# Patient Record
Sex: Female | Born: 1960 | Race: Black or African American | Hispanic: No | Marital: Married | Smoking: Never smoker
Health system: Southern US, Community
[De-identification: ages and names within clinical notes are randomized; demographics above are authoritative.]

## PROBLEM LIST (undated history)

## (undated) DIAGNOSIS — K649 Unspecified hemorrhoids: Secondary | ICD-10-CM

## (undated) DIAGNOSIS — E119 Type 2 diabetes mellitus without complications: Secondary | ICD-10-CM

## (undated) DIAGNOSIS — I10 Essential (primary) hypertension: Secondary | ICD-10-CM

## (undated) DIAGNOSIS — E785 Hyperlipidemia, unspecified: Secondary | ICD-10-CM

## (undated) HISTORY — DX: Hyperlipidemia, unspecified: E78.5

## (undated) HISTORY — DX: Essential (primary) hypertension: I10

## (undated) HISTORY — PX: ABDOMINAL HYSTERECTOMY: SHX81

## (undated) HISTORY — DX: Type 2 diabetes mellitus without complications: E11.9

## (undated) HISTORY — DX: Unspecified hemorrhoids: K64.9

## (undated) HISTORY — PX: HAND SURGERY: SHX662

---

## 2003-11-02 ENCOUNTER — Other Ambulatory Visit: Payer: Self-pay

## 2005-04-29 HISTORY — PX: COLONOSCOPY: SHX174

## 2005-06-26 ENCOUNTER — Ambulatory Visit: Payer: Self-pay | Admitting: Internal Medicine

## 2005-07-23 ENCOUNTER — Ambulatory Visit: Payer: Self-pay | Admitting: Internal Medicine

## 2006-09-25 ENCOUNTER — Ambulatory Visit: Payer: Self-pay | Admitting: Family Medicine

## 2007-01-19 ENCOUNTER — Ambulatory Visit: Payer: Self-pay | Admitting: Internal Medicine

## 2011-10-21 ENCOUNTER — Other Ambulatory Visit (HOSPITAL_COMMUNITY): Payer: Self-pay | Admitting: Internal Medicine

## 2011-10-21 DIAGNOSIS — Z139 Encounter for screening, unspecified: Secondary | ICD-10-CM

## 2011-10-21 DIAGNOSIS — Z0000 Encounter for general adult medical examination without abnormal findings: Secondary | ICD-10-CM

## 2011-10-25 ENCOUNTER — Inpatient Hospital Stay (HOSPITAL_COMMUNITY): Admission: RE | Admit: 2011-10-25 | Payer: Self-pay | Source: Ambulatory Visit

## 2013-05-07 ENCOUNTER — Emergency Department: Payer: Self-pay | Admitting: Emergency Medicine

## 2013-12-07 ENCOUNTER — Telehealth: Payer: Self-pay

## 2013-12-07 NOTE — Telephone Encounter (Signed)
Pt was previously referred by Dr. Gerarda Fraction in May 2015 for colonoscopy. She was sent a letter to check insurance and call with med list etc. Just received another referral saying she is ready to schedule appt now.   LMOM to call.

## 2013-12-08 NOTE — Telephone Encounter (Addendum)
  Gastroenterology Pre-Procedure Review  Request Date: Requesting Physician: Fusco  PATIENT REVIEW QUESTIONS: The patient responded to the following health history questions as indicated:    1. Diabetes Melitis: Yes 2. Joint replacements in the past 12 months: NO 3. Major health problems in the past 3 months: No 4. Has an artificial valve or MVP: NO 5. Has a defibrillator: NO 6. Has been advised in past to take antibiotics in advance of a procedure like teeth cleaning: No 7. Alcohol: No 8. Family History: NO     MEDICATIONS & ALLERGIES:    Patient reports the following regarding taking any blood thinners:   Plavix? NO Aspirin? YES Coumadin? NO  Patient confirms/reports the following medications:  No current outpatient prescriptions on file.   No current facility-administered medications for this visit.    Patient confirms/reports the following allergies:  Allergies not on file  No orders of the defined types were placed in this encounter.    AUTHORIZATION INFORMATION Primary Insurance: [employer] ID #:[id number]   Group #: [id number] Pre-Cert / Josem Kaufmann required:  Pre-Cert / Auth #:   This Gastroenterology Pre-Precedure Review Form is being routed to the following provider(s):

## 2013-12-10 NOTE — Telephone Encounter (Signed)
LMOM to call.

## 2013-12-13 ENCOUNTER — Other Ambulatory Visit: Payer: Self-pay

## 2013-12-13 DIAGNOSIS — Z1211 Encounter for screening for malignant neoplasm of colon: Secondary | ICD-10-CM

## 2013-12-13 NOTE — Telephone Encounter (Signed)
Pt called and is scheduled for colonoscopy with Dr. Gala Romney on 12/29/2013 at 8:30 AM.

## 2013-12-14 NOTE — Telephone Encounter (Signed)
PT called to reschedule her colonoscopy due to transportation. She has been rescheduled to 01/12/2014 at 9:30 AM ( pt request time to have transportation). Denise Madden is aware.

## 2013-12-15 NOTE — Telephone Encounter (Signed)
Appropriate.

## 2013-12-16 MED ORDER — PEG-KCL-NACL-NASULF-NA ASC-C 100 G PO SOLR: 1.0000 | ORAL | Status: DC

## 2013-12-16 NOTE — Telephone Encounter (Signed)
Rx sent to the pharmacy and instructions mailed to pt.  

## 2013-12-30 ENCOUNTER — Encounter (HOSPITAL_COMMUNITY): Payer: Self-pay | Admitting: Pharmacy Technician

## 2013-12-31 ENCOUNTER — Telehealth: Payer: Self-pay

## 2013-12-31 NOTE — Telephone Encounter (Signed)
Pt called and cancelled her appt on 01/12/2014 with Dr. Gala Romney. Denise Madden she has a lot going on now and she will be out of town in October, she will call and reschedule in Nov. [name] is aware.

## 2014-01-12 ENCOUNTER — Ambulatory Visit (HOSPITAL_COMMUNITY): Admission: RE | Admit: 2014-01-12 | Payer: Self-pay | Source: Ambulatory Visit | Admitting: Internal Medicine

## 2014-01-12 ENCOUNTER — Encounter (HOSPITAL_COMMUNITY): Admission: RE | Payer: Self-pay | Source: Ambulatory Visit

## 2014-01-12 SURGERY — COLONOSCOPY
Anesthesia: Moderate Sedation

## 2015-11-01 ENCOUNTER — Encounter: Payer: Self-pay | Admitting: *Deleted

## 2015-11-16 ENCOUNTER — Encounter: Payer: Self-pay | Admitting: General Surgery

## 2015-11-16 ENCOUNTER — Ambulatory Visit (INDEPENDENT_AMBULATORY_CARE_PROVIDER_SITE_OTHER): Payer: BLUE CROSS/BLUE SHIELD | Admitting: General Surgery

## 2015-11-16 ENCOUNTER — Other Ambulatory Visit: Payer: Self-pay

## 2015-11-16 VITALS — BP 130/70 | HR 78 | Resp 12 | Ht 64.0 in | Wt 176.0 lb

## 2015-11-16 DIAGNOSIS — E041 Nontoxic single thyroid nodule: Secondary | ICD-10-CM

## 2015-11-16 DIAGNOSIS — Z1211 Encounter for screening for malignant neoplasm of colon: Secondary | ICD-10-CM

## 2015-11-16 MED ORDER — POLYETHYLENE GLYCOL 3350 17 GM/SCOOP PO POWD: 1.0000 | Freq: Once | ORAL | Status: DC

## 2015-11-16 NOTE — Patient Instructions (Addendum)
Colonoscopy A colonoscopy is an exam to look at the entire large intestine (colon). This exam can help find problems such as tumors, polyps, inflammation, and areas of bleeding. The exam takes about 1 hour.  LET [facility] CARE PROVIDER KNOW ABOUT:   Any allergies you have.  All medicines you are taking, including vitamins, herbs, eye drops, creams, and over-the-counter medicines.  Previous problems you or members of your family have had with the use of anesthetics.  Any blood disorders you have.  Previous surgeries you have had.  Medical conditions you have. RISKS AND COMPLICATIONS  Generally, this is a safe procedure. However, as with any procedure, complications can occur. Possible complications include:  Bleeding.  Tearing or rupture of the colon wall.  Reaction to medicines given during the exam.  Infection (rare). BEFORE THE PROCEDURE   Ask your health care provider about changing or stopping your regular medicines.  You may be prescribed an oral bowel prep. This involves drinking a large amount of medicated liquid, starting the day before your procedure. The liquid will cause you to have multiple loose stools until your stool is almost clear or light green. This cleans out your colon in preparation for the procedure.  Do not eat or drink anything else once you have started the bowel prep, unless your health care provider tells you it is safe to do so.  Arrange for someone to drive you home after the procedure. PROCEDURE   You will be given medicine to help you relax (sedative).  You will lie on your side with your knees bent.  A long, flexible tube with a light and camera on the end (colonoscope) will be inserted through the rectum and into the colon. The camera sends video back to a computer screen as it moves through the colon. The colonoscope also releases carbon dioxide gas to inflate the colon. This helps your health care provider see the area better.  During  the exam, your health care provider may take a small tissue sample (biopsy) to be examined under a microscope if any abnormalities are found.  The exam is finished when the entire colon has been viewed. AFTER THE PROCEDURE   Do not drive for 24 hours after the exam.  You may have a small amount of blood in your stool.  You may pass moderate amounts of gas and have mild abdominal cramping or bloating. This is caused by the gas used to inflate your colon during the exam.  Ask when your test results will be ready and how you will get your results. Make sure you get your test results.   This information is not intended to replace advice given to you by your health care provider. Make sure you discuss any questions you have with your health care provider.   Document Released: 04/12/2000 Document Revised: 02/03/2013 Document Reviewed: 12/21/2012 Elsevier Interactive Patient Education [employer].  The patient is scheduled for a Colonoscopy at [facility] on 12/20/15. She will hold her Metformin the day of prep and procedure. She will hold her Insulin the day of prep only. They are aware to call the day before to get their arrival time. Miralax prescription has been sent into the patient's pharmacy. The patient is aware of date and instructions.

## 2015-11-16 NOTE — Progress Notes (Signed)
Patient ID: Denise Madden, female   DOB: 1960/12/24, 55 y.o.   MRN: [id number]  Chief Complaint  Patient presents with  . Colonoscopy    HPI Denise Madden is a 55 y.o. female here today for an evaluation for a colonoscopy. No GI problems at this time. Last colonoscopy 2007. Showed hemorrhoids but no polyps. No other concerns.  I have reviewed the history of present illness with the patient.   HPI  Past Medical History  Diagnosis Date  . Diabetes mellitus without complication (HCC)   . Hypertension   . Hyperlipidemia   . Hemorrhoids     Past Surgical History  Procedure Laterality Date  . Cesarean section    . Abdominal hysterectomy    . Hand surgery Left   . Colonoscopy  2007    [facility]    Family History  Problem Relation Age of Onset  . Prostate cancer Maternal Grandfather   . Melanoma Father     Social History Social History  Substance Use Topics  . Smoking status: Never Smoker   . Smokeless tobacco: Never Used  . Alcohol Use: No    No Known Allergies  Current Outpatient Prescriptions  Medication Sig Dispense Refill  . amLODipine (NORVASC) 5 MG tablet     . aspirin 81 MG tablet Take 81 mg by mouth daily.    Marland Kitchen. atorvastatin (LIPITOR) 20 MG tablet     . ezetimibe (ZETIA) 10 MG tablet Take 10 mg by mouth daily.    . folic acid (FOLVITE) 800 MCG tablet Take 400 mcg by mouth daily.    . insulin glargine (LANTUS) 100 UNIT/ML injection Inject 60 Units into the skin at bedtime.     Marland Kitchen. lisinopril (PRINIVIL,ZESTRIL) 20 MG tablet Take 20 mg by mouth daily.    . metFORMIN (GLUCOPHAGE) 500 MG tablet Take 500 mg by mouth 2 (two) times daily with a meal.    . Multiple Vitamins-Minerals (MULTIVITAMIN GUMMIES ADULT PO) Take by mouth.    . simvastatin (ZOCOR) 40 MG tablet Take 40 mg by mouth daily.    . polyethylene glycol powder (GLYCOLAX/MIRALAX) powder Take 255 g by mouth once. 255 g 0   No current facility-administered medications for this visit.    Review of  Systems Review of Systems  Constitutional: Negative.   Respiratory: Negative.   Cardiovascular: Negative.   Gastrointestinal: Negative.     Blood pressure 130/70, pulse 78, resp. rate 12, height 5\' 4"  (1.626 m), weight 176 lb (79.833 kg).  Physical Exam Physical Exam  Constitutional: She is oriented to person, place, and time. She appears well-developed and well-nourished.  Eyes: Conjunctivae are normal. No scleral icterus.  Neck: Neck supple.    Cardiovascular: Normal rate, regular rhythm and normal heart sounds.   Pulmonary/Chest: Effort normal and breath sounds normal.  Abdominal: Soft. Bowel sounds are normal. There is no tenderness.  Lymphadenopathy:    She has no cervical adenopathy.  Neurological: She is alert and oriented to person, place, and time.  Skin: Skin is warm and dry.    Data Reviewed Prior notes reviewed  Assessment    Encounter for screening colonoscopy Right thyroid nodule ultrasound: nodule ~1.5x1cm (isoechoic with rest of the gland)  nodule located within the right thyroid isthmus. Not cause for concern at this time. Will follow.     Plan    Colonoscopy with possible biopsy/polypectomy prn: Information regarding the procedure, including its potential risks and complications (including but not limited to perforation of the bowel,  which may require emergency surgery to repair, and bleeding) was verbally given to the patient. Educational information regarding lower intestinal endoscopy was given to the patient. Written instructions for how to complete the bowel prep using Miralax were provided. The importance of drinking ample fluids to avoid dehydration as a result of the prep emphasized. Patient to return in six months for a follow up thyroid exam and ulrasound.     This information has been scribed by Gaspar Cola CMA.  The patient is scheduled for a Colonoscopy at [facility] on 12/20/15. She will hold her Metformin the day of prep and procedure. She will  hold her Insulin the day of prep only. They are aware to call the day before to get their arrival time. Miralax prescription has been sent into the patient's pharmacy. The patient is aware of date and instructions.   PCP: Dr. Johny Blamer 11/16/2015, 11:37 AM

## 2015-12-12 ENCOUNTER — Other Ambulatory Visit: Payer: Self-pay | Admitting: General Surgery

## 2015-12-19 ENCOUNTER — Encounter: Payer: Self-pay | Admitting: *Deleted

## 2015-12-20 ENCOUNTER — Encounter: Payer: Self-pay | Admitting: *Deleted

## 2015-12-20 ENCOUNTER — Ambulatory Visit: Payer: BLUE CROSS/BLUE SHIELD | Admitting: Anesthesiology

## 2015-12-20 ENCOUNTER — Ambulatory Visit
Admission: RE | Admit: 2015-12-20 | Discharge: 2015-12-20 | Disposition: A | Discharge status: Home / Self Care | Payer: BLUE CROSS/BLUE SHIELD | Source: Ambulatory Visit | Attending: General Surgery | Admitting: General Surgery

## 2015-12-20 ENCOUNTER — Encounter
Admission: RE | Disposition: A | Discharge status: Home / Self Care | Payer: Self-pay | Source: Ambulatory Visit | Attending: General Surgery

## 2015-12-20 DIAGNOSIS — Z808 Family history of malignant neoplasm of other organs or systems: Secondary | ICD-10-CM | POA: Insufficient documentation

## 2015-12-20 DIAGNOSIS — I10 Essential (primary) hypertension: Secondary | ICD-10-CM | POA: Insufficient documentation

## 2015-12-20 DIAGNOSIS — E119 Type 2 diabetes mellitus without complications: Secondary | ICD-10-CM | POA: Diagnosis not present

## 2015-12-20 DIAGNOSIS — K625 Hemorrhage of anus and rectum: Secondary | ICD-10-CM | POA: Diagnosis not present

## 2015-12-20 DIAGNOSIS — Z1211 Encounter for screening for malignant neoplasm of colon: Secondary | ICD-10-CM | POA: Insufficient documentation

## 2015-12-20 DIAGNOSIS — D122 Benign neoplasm of ascending colon: Secondary | ICD-10-CM

## 2015-12-20 DIAGNOSIS — E785 Hyperlipidemia, unspecified: Secondary | ICD-10-CM | POA: Diagnosis not present

## 2015-12-20 DIAGNOSIS — Z90710 Acquired absence of both cervix and uterus: Secondary | ICD-10-CM | POA: Diagnosis not present

## 2015-12-20 DIAGNOSIS — Z7982 Long term (current) use of aspirin: Secondary | ICD-10-CM | POA: Insufficient documentation

## 2015-12-20 DIAGNOSIS — Z794 Long term (current) use of insulin: Secondary | ICD-10-CM | POA: Diagnosis not present

## 2015-12-20 DIAGNOSIS — Z6829 Body mass index (BMI) 29.0-29.9, adult: Secondary | ICD-10-CM | POA: Insufficient documentation

## 2015-12-20 DIAGNOSIS — K640 First degree hemorrhoids: Secondary | ICD-10-CM | POA: Diagnosis not present

## 2015-12-20 DIAGNOSIS — Z79899 Other long term (current) drug therapy: Secondary | ICD-10-CM | POA: Insufficient documentation

## 2015-12-20 DIAGNOSIS — Z8042 Family history of malignant neoplasm of prostate: Secondary | ICD-10-CM | POA: Diagnosis not present

## 2015-12-20 DIAGNOSIS — K644 Residual hemorrhoidal skin tags: Secondary | ICD-10-CM | POA: Diagnosis not present

## 2015-12-20 HISTORY — PX: COLONOSCOPY WITH PROPOFOL: SHX5780

## 2015-12-20 LAB — GLUCOSE, CAPILLARY: Glucose-Capillary: 138 mg/dL — ABNORMAL HIGH (ref 65–99)

## 2015-12-20 SURGERY — COLONOSCOPY WITH PROPOFOL
Anesthesia: General

## 2015-12-20 MED ORDER — MIDAZOLAM HCL 2 MG/2ML IJ SOLN
INTRAMUSCULAR | Status: DC | PRN
  Administered 2015-12-20: 1 mg via INTRAVENOUS

## 2015-12-20 MED ORDER — FENTANYL CITRATE (PF) 100 MCG/2ML IJ SOLN
INTRAMUSCULAR | Status: DC | PRN
  Administered 2015-12-20: 50 ug via INTRAVENOUS

## 2015-12-20 MED ORDER — LIDOCAINE HCL (PF) 1 % IJ SOLN
INTRAMUSCULAR | Status: DC | PRN
  Administered 2015-12-20: 3 mL

## 2015-12-20 MED ORDER — PROPOFOL 10 MG/ML IV BOLUS
INTRAVENOUS | Status: DC | PRN
  Administered 2015-12-20: 10 mg via INTRAVENOUS

## 2015-12-20 MED ORDER — PROPOFOL 500 MG/50ML IV EMUL
INTRAVENOUS | Status: DC | PRN
  Administered 2015-12-20: 12 ug/kg/min via INTRAVENOUS

## 2015-12-20 MED ORDER — SODIUM CHLORIDE 0.9 % IV SOLN
INTRAVENOUS | Status: DC
  Administered 2015-12-20: 10:00:00 via INTRAVENOUS

## 2015-12-20 NOTE — Op Note (Signed)
[facility] Gastroenterology Patient Name: Denise Madden Procedure Date: 12/20/2015 9:36 AM MRN: [id number] Account #: [id number] Date of Birth: 03-11-1961 Admit Type: Outpatient Age: 55 Room: [facility] ENDO ROOM 1 Gender: Female Note Status: Finalized Procedure:            Colonoscopy Indications:          Screening for colorectal malignant neoplasm Providers:            Seeplaputhur G. Jamal Collin, MD Referring MD:         Leona Carry. Hall Busing, MD (Referring MD) Medicines:            General Anesthesia Complications:        No immediate complications. Procedure:            Pre-Anesthesia Assessment:                       - General anesthesia under the supervision of an                        anesthesiologist was determined to be medically                        necessary for this procedure based on review of the                        patient's medical history, medications, and prior                        anesthesia history.                       After obtaining informed consent, the colonoscope was                        passed under direct vision. Throughout the procedure,                        the patient's blood pressure, pulse, and oxygen                        saturations were monitored continuously. The                        Colonoscope was introduced through the anus and                        advanced to the the cecum, identified by the ileocecal                        valve. The colonoscopy was performed without                        difficulty. The patient tolerated the procedure well.                        The quality of the bowel preparation was excellent. Findings:      The perianal and digital rectal examinations were normal.      A 3 mm polyp was found in the ascending colon. The polyp was sessile.       The polyp was removed with a cold biopsy forceps. Resection  and       retrieval were complete.      Internal hemorrhoids were found during retroflexion.  The hemorrhoids       were Grade I (internal hemorrhoids that do not prolapse).      The exam was otherwise without abnormality on direct and retroflexion       views. Impression:           - One 3 mm polyp in the ascending colon, removed with a                        cold biopsy forceps. Resected and retrieved.                       - Internal hemorrhoids.                       - The examination was otherwise normal on direct and                        retroflexion views. Recommendation:       - Discharge patient to home.                       - Resume regular diet.                       - Repeat colonoscopy in 5 years for surveillance. Procedure Code(s):    --- Professional ---                       [phone], Colonoscopy, flexible; with biopsy, single or                        multiple Diagnosis Code(s):    --- Professional ---                       Z12.11, Encounter for screening for malignant neoplasm                        of colon                       D12.2, Benign neoplasm of ascending colon                       K64.0, First degree hemorrhoids CPT copyright 2016 American Medical Association. All rights reserved. The codes documented in this report are preliminary and upon coder review may  be revised to meet current compliance requirements. Christene Lye, MD 12/20/2015 10:11:20 AM This report has been signed electronically. Number of Addenda: 0 Note Initiated On: 12/20/2015 9:36 AM Scope Withdrawal Time: 0 hours 5 minutes 24 seconds  Total Procedure Duration: 0 hours 23 minutes 2 seconds       [facility]

## 2015-12-20 NOTE — Op Note (Addendum)
[facility] Gastroenterology Patient Name: Denise Madden Procedure Date: 12/20/2015 8:58 AM MRN: [id number] Account #: [id number] Date of Birth: Feb 07, 1961 Admit Type: Outpatient Age: 55 Room: [facility] ENDO ROOM 1 Gender: Female Note Status: Supervisor Override THIS EXAM WAS SENT IN ERROR

## 2015-12-20 NOTE — Anesthesia Preprocedure Evaluation (Signed)
Anesthesia Evaluation  Patient identified by MRN, date of birth, ID band Patient awake    Reviewed: Allergy & Precautions, NPO status , Patient's Chart, lab work & pertinent test results  History of Anesthesia Complications Negative for: history of anesthetic complications  Airway Mallampati: II       Dental  (+) Teeth Intact   Pulmonary neg pulmonary ROS,    breath sounds clear to auscultation       Cardiovascular Exercise Tolerance: Good hypertension, Pt. on medications  Rhythm:Regular     Neuro/Psych    GI/Hepatic negative GI ROS, Neg liver ROS,   Endo/Other  diabetes, Type 1, Insulin DependentMorbid obesity  Renal/GU negative Renal ROS     Musculoskeletal   Abdominal (+) + obese,   Peds  Hematology   Anesthesia Other Findings   Reproductive/Obstetrics                             Anesthesia Physical Anesthesia Plan  ASA: II  Anesthesia Plan: General   Post-op Pain Management:    Induction: Intravenous  Airway Management Planned: Natural Airway and Nasal Cannula  Additional Equipment:   Intra-op Plan:   Post-operative Plan:   Informed Consent: I have reviewed the patients History and Physical, chart, labs and discussed the procedure including the risks, benefits and alternatives for the proposed anesthesia with the patient or authorized representative who has indicated his/her understanding and acceptance.     Plan Discussed with:   Anesthesia Plan Comments:         Anesthesia Quick Evaluation

## 2015-12-20 NOTE — H&P (Signed)
Denise Madden is an 55 y.o. female.   Chief Complaint: Here for screening colonoscopy HPI:No GI complaints. Last colonoscopy was in 2007  Past Medical History:  Diagnosis Date  . Diabetes mellitus without complication ([place])   . Hemorrhoids   . Hyperlipidemia   . Hypertension     Past Surgical History:  Procedure Laterality Date  . ABDOMINAL HYSTERECTOMY    . CESAREAN SECTION    . COLONOSCOPY  2007   [facility]  . HAND SURGERY Left     Family History  Problem Relation Age of Onset  . Melanoma Father   . Prostate cancer Maternal Grandfather    Social History:  reports that she has never smoked. She has never used smokeless tobacco. She reports that she does not drink alcohol or use drugs.  Allergies: No Known Allergies  Medications Prior to Admission  Medication Sig Dispense Refill  . polyethylene glycol powder (GLYCOLAX/MIRALAX) powder Take 255 g by mouth once. 255 g 0  . amLODipine (NORVASC) 5 MG tablet     . aspirin 81 MG tablet Take 81 mg by mouth daily.    Marland Kitchen atorvastatin (LIPITOR) 20 MG tablet     . ezetimibe (ZETIA) 10 MG tablet Take 10 mg by mouth daily.    . folic acid (FOLVITE) [id number] MCG tablet Take 400 mcg by mouth daily.    . insulin glargine (LANTUS) 100 UNIT/ML injection Inject 60 Units into the skin at bedtime.     Marland Kitchen lisinopril (PRINIVIL,ZESTRIL) 20 MG tablet Take 20 mg by mouth daily.    . metFORMIN (GLUCOPHAGE) 500 MG tablet Take 500 mg by mouth 2 (two) times daily with a meal.    . Multiple Vitamins-Minerals (MULTIVITAMIN GUMMIES ADULT PO) Take by mouth.    . simvastatin (ZOCOR) 40 MG tablet Take 40 mg by mouth daily.      Results for orders placed or performed during the hospital encounter of 12/20/15 (from the past 48 hour(s))  Glucose, capillary     Status: Abnormal   Collection Time: 12/20/15  9:12 AM  Result Value Ref Range   Glucose-Capillary 138 (H) 65 - 99 mg/dL   No results found.  Review of Systems  Constitutional: Negative.   Respiratory:  Negative.   Cardiovascular: Negative.   Gastrointestinal: Negative.   Genitourinary: Negative.     Blood pressure (!) 186/75, pulse (!) 50, temperature (!) 96.7 F (35.9 C), temperature source Tympanic, resp. rate 14, height 5\' 3"  (1.6 m), weight 165 lb (74.8 kg), SpO2 100 %. Physical Exam  Constitutional: She is oriented to person, place, and time. She appears well-developed and well-nourished.  Eyes: Conjunctivae are normal. No scleral icterus.  Cardiovascular: Normal rate, regular rhythm and normal heart sounds.   Respiratory: Effort normal and breath sounds normal.  GI: Soft. Bowel sounds are normal. She exhibits no mass. There is no tenderness.  Neurological: She is alert and oriented to person, place, and time.  Skin: Skin is warm and dry.     Assessment/Plan OK to proceed with colonoscopy  Christene Lye, MD 12/20/2015, 9:35 AM

## 2015-12-20 NOTE — Anesthesia Postprocedure Evaluation (Signed)
Anesthesia Post Note  Patient: Denise Madden  Procedure(s) Performed: Procedure(s) (LRB): COLONOSCOPY WITH PROPOFOL (N/A)  Patient location during evaluation: PACU Anesthesia Type: General Level of consciousness: awake Pain management: pain level controlled Vital Signs Assessment: post-procedure vital signs reviewed and stable Respiratory status: nonlabored ventilation Cardiovascular status: stable Anesthetic complications: no    Last Vitals:  Vitals:   12/20/15 1031 12/20/15 1041  BP: (!) 165/78 (!) 157/85  Pulse: (!) 51 (!) 49  Resp: 17 13  Temp:      Last Pain:  Vitals:   12/20/15 1011  TempSrc: Tympanic                 VAN STAVEREN,Stephanine Reas

## 2015-12-20 NOTE — Transfer of Care (Signed)
Immediate Anesthesia Transfer of Care Note  Patient: Denise Madden  Procedure(s) Performed: Procedure(s): COLONOSCOPY WITH PROPOFOL (N/A)  Patient Location: PACU  Anesthesia Type:General  Level of Consciousness: awake  Airway & Oxygen Therapy: Patient Spontanous Breathing and Patient connected to nasal cannula oxygen  Post-op Assessment: Report given to RN and Post -op Vital signs reviewed and stable  Post vital signs: Reviewed  Last Vitals:  Vitals:   12/20/15 0914  BP: (!) 186/75  Pulse: (!) 50  Resp: 14  Temp: (!) 35.9 C    Last Pain:  Vitals:   12/20/15 0914  TempSrc: Tympanic         Complications: No apparent anesthesia complications

## 2015-12-21 ENCOUNTER — Encounter: Payer: Self-pay | Admitting: General Surgery

## 2015-12-21 ENCOUNTER — Telehealth: Payer: Self-pay | Admitting: *Deleted

## 2015-12-21 NOTE — Telephone Encounter (Signed)
-----   Message from Christene Lye, MD sent at 12/21/2015  4:48 PM EDT ----- Rosann Auerbach please let pt pt know the pathology was normal benign polyp

## 2015-12-21 NOTE — Telephone Encounter (Signed)
Left message to call office. Pt Placed in Recall for 5 yr.

## 2015-12-26 ENCOUNTER — Encounter: Payer: Self-pay | Admitting: *Deleted

## 2016-01-03 LAB — SURGICAL PATHOLOGY

## 2016-01-03 NOTE — Telephone Encounter (Signed)
Multiple attempts made to contact patient regarding resent biopsy results, even a mychart message was sent. Mailed letter to call office.

## 2016-05-15 ENCOUNTER — Ambulatory Visit: Payer: BLUE CROSS/BLUE SHIELD | Admitting: General Surgery

## 2016-07-31 ENCOUNTER — Ambulatory Visit: Payer: BLUE CROSS/BLUE SHIELD | Admitting: General Surgery

## 2016-09-03 ENCOUNTER — Encounter: Payer: Self-pay | Admitting: *Deleted

## 2016-12-18 ENCOUNTER — Emergency Department
Admission: EM | Admit: 2016-12-18 | Discharge: 2016-12-18 | Disposition: A | Discharge status: Home / Self Care | Payer: BLUE CROSS/BLUE SHIELD | Attending: Emergency Medicine | Admitting: Emergency Medicine

## 2016-12-18 ENCOUNTER — Encounter: Payer: Self-pay | Admitting: Emergency Medicine

## 2016-12-18 DIAGNOSIS — Z043 Encounter for examination and observation following other accident: Secondary | ICD-10-CM | POA: Diagnosis not present

## 2016-12-18 MED ORDER — MELOXICAM 7.5 MG PO TABS: 7.5000 mg | ORAL_TABLET | Freq: Every day | ORAL | 1 refills | Status: AC

## 2016-12-18 MED ORDER — CYCLOBENZAPRINE HCL 5 MG PO TABS: 5.0000 mg | ORAL_TABLET | Freq: Three times a day (TID) | ORAL | 0 refills | Status: AC | PRN

## 2016-12-18 MED ORDER — KETOROLAC TROMETHAMINE 30 MG/ML IJ SOLN
30.0000 mg | Freq: Once | INTRAMUSCULAR | Status: AC
  Administered 2016-12-18: 30 mg via INTRAMUSCULAR
  Filled 2016-12-18: qty 1
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
## 2018-10-19 ENCOUNTER — Other Ambulatory Visit: Payer: Self-pay | Admitting: Internal Medicine

## 2018-10-19 DIAGNOSIS — Z1231 Encounter for screening mammogram for malignant neoplasm of breast: Secondary | ICD-10-CM

## 2018-10-27 ENCOUNTER — Ambulatory Visit
Admission: RE | Admit: 2018-10-27 | Discharge: 2018-10-27 | Disposition: A | Discharge status: Home / Self Care | Payer: BC Managed Care – PPO | Source: Ambulatory Visit | Attending: Internal Medicine | Admitting: Internal Medicine

## 2018-10-27 ENCOUNTER — Other Ambulatory Visit: Payer: Self-pay

## 2018-10-27 DIAGNOSIS — Z1231 Encounter for screening mammogram for malignant neoplasm of breast: Secondary | ICD-10-CM

## 2018-10-29 ENCOUNTER — Ambulatory Visit (INDEPENDENT_AMBULATORY_CARE_PROVIDER_SITE_OTHER): Payer: BC Managed Care – PPO | Admitting: Advanced Practice Midwife

## 2018-10-29 ENCOUNTER — Other Ambulatory Visit: Payer: Self-pay

## 2018-10-29 ENCOUNTER — Encounter: Payer: Self-pay | Admitting: Advanced Practice Midwife

## 2018-10-29 VITALS — BP 140/80 | HR 60 | Ht 63.0 in | Wt 166.0 lb

## 2018-10-29 DIAGNOSIS — Z0000 Encounter for general adult medical examination without abnormal findings: Secondary | ICD-10-CM

## 2018-10-29 DIAGNOSIS — Z01419 Encounter for gynecological examination (general) (routine) without abnormal findings: Secondary | ICD-10-CM

## 2018-10-29 NOTE — Patient Instructions (Signed)
Mediterranean Diet A Mediterranean diet refers to food and lifestyle choices that are based on the traditions of countries located on the [employer]. This way of eating has been shown to help prevent certain conditions and improve outcomes for people who have chronic diseases, like kidney disease and heart disease. What are tips for following this plan? Lifestyle  Cook and eat meals together with your family, when possible.  Drink enough fluid to keep your urine clear or pale yellow.  Be physically active every day. This includes: ? Aerobic exercise like running or swimming. ? Leisure activities like gardening, walking, or housework.  Get 7-8 hours of sleep each night.  If recommended by your health care provider, drink red wine in moderation. This means 1 glass a day for nonpregnant women and 2 glasses a day for men. A glass of wine equals 5 oz (150 mL). Reading food labels   Check the serving size of packaged foods. For foods such as rice and pasta, the serving size refers to the amount of cooked product, not dry.  Check the total fat in packaged foods. Avoid foods that have saturated fat or trans fats.  Check the ingredients list for added sugars, such as corn syrup. Shopping  At the grocery store, buy most of your food from the areas near the walls of the store. This includes: ? Fresh fruits and vegetables (produce). ? Grains, beans, nuts, and seeds. Some of these may be available in unpackaged forms or large amounts (in bulk). ? Fresh seafood. ? Poultry and eggs. ? Low-fat dairy products.  Buy whole ingredients instead of prepackaged foods.  Buy fresh fruits and vegetables in-season from local farmers markets.  Buy frozen fruits and vegetables in resealable bags.  If you do not have access to quality fresh seafood, buy precooked frozen shrimp or canned fish, such as tuna, salmon, or sardines.  Buy small amounts of raw or cooked vegetables, salads, or olives from  the deli or salad bar at your store.  Stock your pantry so you always have certain foods on hand, such as olive oil, canned tuna, canned tomatoes, rice, pasta, and beans. Cooking  Cook foods with extra-virgin olive oil instead of using butter or other vegetable oils.  Have meat as a side dish, and have vegetables or grains as your main dish. This means having meat in small portions or adding small amounts of meat to foods like pasta or stew.  Use beans or vegetables instead of meat in common dishes like chili or lasagna.  Experiment with different cooking methods. Try roasting or broiling vegetables instead of steaming or sauteing them.  Add frozen vegetables to soups, stews, pasta, or rice.  Add nuts or seeds for added healthy fat at each meal. You can add these to yogurt, salads, or vegetable dishes.  Marinate fish or vegetables using olive oil, lemon juice, garlic, and fresh herbs. Meal planning   Plan to eat 1 vegetarian meal one day each week. Try to work up to 2 vegetarian meals, if possible.  Eat seafood 2 or more times a week.  Have healthy snacks readily available, such as: ? Vegetable sticks with hummus. ? [place] yogurt. ? Fruit and nut trail mix.  Eat balanced meals throughout the week. This includes: ? Fruit: 2-3 servings a day ? Vegetables: 4-5 servings a day ? Low-fat dairy: 2 servings a day ? Fish, poultry, or lean meat: 1 serving a day ? Beans and legumes: 2 or more servings a week ?  Nuts and seeds: 1-2 servings a day ? Whole grains: 6-8 servings a day ? Extra-virgin olive oil: 3-4 servings a day  Limit red meat and sweets to only a few servings a month What are my food choices?  Mediterranean diet ? Recommended  Grains: Whole-grain pasta. Brown rice. Bulgar wheat. Polenta. Couscous. Whole-wheat bread. [place].  Vegetables: Artichokes. Beets. Broccoli. Cabbage. Carrots. Eggplant. Green beans. Chard. Kale. Spinach. Onions. Leeks. Peas. Squash.  Tomatoes. Peppers. Radishes.  Fruits: Apples. Apricots. Avocado. Berries. Bananas. Cherries. Dates. Figs. Grapes. Lemons. Melon. Oranges. Peaches. Plums. Pomegranate.  Meats and other protein foods: Beans. Almonds. Sunflower seeds. Pine nuts. Peanuts. [place]. Salmon. Scallops. Shrimp. [place]. Tilapia. Clams. Oysters. Eggs.  Dairy: Low-fat milk. Cheese. Greek yogurt.  Beverages: Water. Red wine. Herbal tea.  Fats and oils: Extra virgin olive oil. Avocado oil. Grape seed oil.  Sweets and desserts: [place] yogurt with honey. Baked apples. Poached pears. Trail mix.  Seasoning and other foods: Basil. Cilantro. Coriander. Cumin. Mint. Parsley. Sage. Rosemary. Tarragon. Garlic. Oregano. Thyme. Pepper. Balsalmic vinegar. Tahini. Hummus. Tomato sauce. Olives. Mushrooms. ? Limit these  Grains: Prepackaged pasta or rice dishes. Prepackaged cereal with added sugar.  Vegetables: Deep fried potatoes (french fries).  Fruits: Fruit canned in syrup.  Meats and other protein foods: Beef. Pork. Lamb. Poultry with skin. Hot dogs. [place] Salines.  Dairy: Ice cream. Sour cream. Whole milk.  Beverages: Juice. Sugar-sweetened soft drinks. Beer. Liquor and spirits.  Fats and oils: Butter. Canola oil. Vegetable oil. Beef fat (tallow). Lard.  Sweets and desserts: Cookies. Cakes. Pies. Candy.  Seasoning and other foods: Mayonnaise. Premade sauces and marinades. The items listed may not be a complete list. Talk with your dietitian about what dietary choices are right for you. Summary  The Mediterranean diet includes both food and lifestyle choices.  Eat a variety of fresh fruits and vegetables, beans, nuts, seeds, and whole grains.  Limit the amount of red meat and sweets that you eat.  Talk with your health care provider about whether it is safe for you to drink red wine in moderation. This means 1 glass a day for nonpregnant women and 2 glasses a day for men. A glass of wine equals 5 oz (150 mL). This information  is not intended to replace advice given to you by your health care provider. Make sure you discuss any questions you have with your health care provider. Document Released: 12/07/2015 Document Revised: 12/14/2015 Document Reviewed: 12/07/2015 Elsevier Patient Education  2020 [place] (AHA) Exercise Recommendation  Being physically active is important to prevent heart disease and stroke, the nations No. 1and No. 5killers. To improve overall cardiovascular health, we suggest at least 150 minutes per week of moderate exercise or 75 minutes per week of vigorous exercise (or a combination of moderate and vigorous activity). Thirty minutes a day, five times a week is an easy goal to remember. You will also experience benefits even if you divide your time into two or three segments of 10 to 15 minutes per day.  For people who would benefit from lowering their blood pressure or cholesterol, we recommend 40 minutes of aerobic exercise of moderate to vigorous intensity three to four times a week to lower the risk for heart attack and stroke.  Physical activity is anything that makes you move your body and burn calories.  This includes things like climbing stairs or playing sports. Aerobic exercises benefit your heart, and include walking, jogging, swimming or biking. Strength and stretching  exercises are best for overall stamina and flexibility.  The simplest, positive change you can make to effectively improve your heart health is to start walking. It's enjoyable, free, easy, social and great exercise. A walking program is flexible and boasts high success rates because people can stick with it. It's easy for walking to become a regular and satisfying part of life.   For Overall Cardiovascular Health:  At least 30 minutes of moderate-intensity aerobic activity at least 5 days per week for a total of 150  OR   At least 25 minutes of vigorous aerobic activity at least 3  days per week for a total of 75 minutes; or a combination of moderate- and vigorous-intensity aerobic activity  AND   Moderate- to high-intensity muscle-strengthening activity at least 2 days per week for additional health benefits.  For Lowering Blood Pressure and Cholesterol  An average 40 minutes of moderate- to vigorous-intensity aerobic activity 3 or 4 times per week  What if I cant make it to the time goal? Something is always better than nothing! And everyone has to start somewhere. Even if you've been sedentary for years, today is the day you can begin to make healthy changes in your life. If you don't think you'll make it for 30 or 40 minutes, set a reachable goal for today. You can work up toward your overall goal by increasing your time as you get stronger. Don't let all-or-nothing thinking rob you of doing what you can every day.  Source:http://www.heart.org   Health Maintenance, Female Adopting a healthy lifestyle and getting preventive care are important in promoting health and wellness. Ask your health care provider about:  The right schedule for you to have regular tests and exams.  Things you can do on your own to prevent diseases and keep yourself healthy. What should I know about diet, weight, and exercise? Eat a healthy diet   Eat a diet that includes plenty of vegetables, fruits, low-fat dairy products, and lean protein.  Do not eat a lot of foods that are high in solid fats, added sugars, or sodium. Maintain a healthy weight Body mass index (BMI) is used to identify weight problems. It estimates body fat based on height and weight. Your health care provider can help determine your BMI and help you achieve or maintain a healthy weight. Get regular exercise Get regular exercise. This is one of the most important things you can do for your health. Most adults should:  Exercise for at least 150 minutes each week. The exercise should increase your heart rate and  make you sweat (moderate-intensity exercise).  Do strengthening exercises at least twice a week. This is in addition to the moderate-intensity exercise.  Spend less time sitting. Even light physical activity can be beneficial. Watch cholesterol and blood lipids Have your blood tested for lipids and cholesterol at 60 years of age, then have this test every 5 years. Have your cholesterol levels checked more often if:  Your lipid or cholesterol levels are high.  You are older than 60 years of age.  You are at high risk for heart disease. What should I know about cancer screening? Depending on your health history and family history, you may need to have cancer screening at various ages. This may include screening for:  Breast cancer.  Cervical cancer.  Colorectal cancer.  Skin cancer.  Lung cancer. What should I know about heart disease, diabetes, and high blood pressure? Blood pressure and heart disease  High blood  pressure causes heart disease and increases the risk of stroke. This is more likely to develop in people who have high blood pressure readings, are of African descent, or are overweight.  Have your blood pressure checked: ? Every 3-5 years if you are 97-55 years of age. ? Every year if you are 5 years old or older. Diabetes Have regular diabetes screenings. This checks your fasting blood sugar level. Have the screening done:  Once every three years after age 53 if you are at a normal weight and have a low risk for diabetes.  More often and at a younger age if you are overweight or have a high risk for diabetes. What should I know about preventing infection? Hepatitis B If you have a higher risk for hepatitis B, you should be screened for this virus. Talk with your health care provider to find out if you are at risk for hepatitis B infection. Hepatitis C Testing is recommended for:  Everyone born from [birth year] through 1967.  Anyone with known risk factors for  hepatitis C. Sexually transmitted infections (STIs)  Get screened for STIs, including gonorrhea and chlamydia, if: ? You are sexually active and are younger than 60 years of age. ? You are older than 60 years of age and your health care provider tells you that you are at risk for this type of infection. ? Your sexual activity has changed since you were last screened, and you are at increased risk for chlamydia or gonorrhea. Ask your health care provider if you are at risk.  Ask your health care provider about whether you are at high risk for HIV. Your health care provider may recommend a prescription medicine to help prevent HIV infection. If you choose to take medicine to prevent HIV, you should first get tested for HIV. You should then be tested every 3 months for as long as you are taking the medicine. Pregnancy  If you are about to stop having your period (premenopausal) and you may become pregnant, seek counseling before you get pregnant.  Take 400 to 800 micrograms (mcg) of folic acid every day if you become pregnant.  Ask for birth control (contraception) if you want to prevent pregnancy. Osteoporosis and menopause Osteoporosis is a disease in which the bones lose minerals and strength with aging. This can result in bone fractures. If you are 7 years old or older, or if you are at risk for osteoporosis and fractures, ask your health care provider if you should:  Be screened for bone loss.  Take a calcium or vitamin D supplement to lower your risk of fractures.  Be given hormone replacement therapy (HRT) to treat symptoms of menopause. Follow these instructions at home: Lifestyle  Do not use any products that contain nicotine or tobacco, such as cigarettes, e-cigarettes, and chewing tobacco. If you need help quitting, ask your health care provider.  Do not use street drugs.  Do not share needles.  Ask your health care provider for help if you need support or information about  quitting drugs. Alcohol use  Do not drink alcohol if: ? Your health care provider tells you not to drink. ? You are pregnant, may be pregnant, or are planning to become pregnant.  If you drink alcohol: ? Limit how much you use to 0-1 drink a day. ? Limit intake if you are breastfeeding.  Be aware of how much alcohol is in your drink. In the [place] one drink equals one 12 oz bottle of beer (  355 mL), one 5 oz glass of wine (148 mL), or one 1 oz glass of hard liquor (44 mL). General instructions  Schedule regular health, dental, and eye exams.  Stay current with your vaccines.  Tell your health care provider if: ? You often feel depressed. ? You have ever been abused or do not feel safe at home. Summary  Adopting a healthy lifestyle and getting preventive care are important in promoting health and wellness.  Follow your health care provider's instructions about healthy diet, exercising, and getting tested or screened for diseases.  Follow your health care provider's instructions on monitoring your cholesterol and blood pressure. This information is not intended to replace advice given to you by your health care provider. Make sure you discuss any questions you have with your health care provider. Document Released: 10/29/2010 Document Revised: 04/08/2018 Document Reviewed: 04/08/2018 Elsevier Patient Education  2020 [employer].

## 2018-10-29 NOTE — Progress Notes (Signed)
Gynecology Annual Exam  PCP: Albina Billet, MD  Chief Complaint:  Chief Complaint  Patient presents with   Gynecologic Exam    No complaints    History of Present Illness:Patient is a 58 y.o. G2P2 presents for annual exam. The patient has no gyn complaints today.   LMP: No LMP recorded. Patient has had a hysterectomy. Kerby Nora states Total abdominal hysterectomy (was in 1985 or 1986 per patient). She tells me that Dr Glennon Mac did not do a PAP smear at her last annual with [facility] due to her history. She has been with the same partner for the past 42 years and has never had an abnormal PAP.   Postcoital Bleeding: no Dysmenorrhea: not applicable  The patient is sexually active. She denies dyspareunia.  The patient doesperform self breast exams.  There is no notable family history of breast or ovarian cancer in her family.  The patient wears seatbelts: yes.   The patient has regular exercise: she does some walking at her job and she spends time with her grandchildren. She has tried to improve her diet and states her HgbA1C was lower at her last visit with her PCP.    The patient denies current symptoms of depression.     Review of Systems: Review of Systems  Constitutional: Negative.   HENT: Negative.   Eyes: Negative.   Respiratory: Negative.   Cardiovascular: Negative.   Gastrointestinal: Negative.   Genitourinary: Negative.   Musculoskeletal: Negative.   Skin: Negative.   Neurological: Negative.   Endo/Heme/Allergies: Negative.   Psychiatric/Behavioral: Negative.     Past Medical History:  Past Medical History:  Diagnosis Date   Diabetes mellitus without complication ([place])    Hemorrhoids    Hyperlipidemia    Hypertension     Past Surgical History:  Past Surgical History:  Procedure Laterality Date   ABDOMINAL HYSTERECTOMY     CESAREAN SECTION     COLONOSCOPY  2007   [facility]   COLONOSCOPY WITH PROPOFOL N/A 12/20/2015   Procedure: COLONOSCOPY WITH  PROPOFOL;  Surgeon: Christene Lye, MD;  Location: [facility] ENDOSCOPY;  Service: Endoscopy;  Laterality: N/A;   HAND SURGERY Left     Gynecologic History:  No LMP recorded. Patient has had a hysterectomy. Last Pap:  Results were:  no abnormalities  Last mammogram: 2 days ago  Results were: report not available. She was not told about any concerns.  Obstetric History: G2P2  Family History:  Family History  Problem Relation Age of Onset   Melanoma Father    Prostate cancer Maternal Grandfather    Breast cancer Paternal Aunt 75    Social History:  Social History   Socioeconomic History   Marital status: Married    Spouse name: Not on file   Number of children: Not on file   Years of education: Not on file   Highest education level: Not on file  Occupational History   Not on file  Social Needs   Financial resource strain: Not on file   Food insecurity    Worry: Not on file    Inability: Not on file   Transportation needs    Medical: Not on file    Non-medical: Not on file  Tobacco Use   Smoking status: Never Smoker   Smokeless tobacco: Never Used  Substance and Sexual Activity   Alcohol use: No    Alcohol/week: 0.0 standard drinks   Drug use: No   Sexual activity: Not Currently  Lifestyle  Physical activity    Days per week: 0 days    Minutes per session: Not on file   Stress: Not on file  Relationships   Social connections    Talks on phone: Not on file    Gets together: Not on file    Attends religious service: Not on file    Active member of club or organization: Not on file    Attends meetings of clubs or organizations: Not on file    Relationship status: Not on file   Intimate partner violence    Fear of current or ex partner: Not on file    Emotionally abused: Not on file    Physically abused: Not on file    Forced sexual activity: Not on file  Other Topics Concern   Not on file  Social History Narrative   Not on file     Allergies:  No Known Allergies  Medications: Prior to Admission medications   Medication Sig Start Date End Date Taking? Authorizing Provider  amLODipine (NORVASC) 5 MG tablet  08/10/15  Yes [provider]  aspirin 81 MG tablet Take 81 mg by mouth daily.   Yes [provider]  atorvastatin (LIPITOR) 20 MG tablet  08/10/15  Yes [provider]  ezetimibe (ZETIA) 10 MG tablet Take 10 mg by mouth daily.   Yes [provider]  folic acid (FOLVITE) 275 MCG tablet Take 400 mcg by mouth daily.   Yes [provider]  insulin glargine (LANTUS) 100 UNIT/ML injection Inject 60 Units into the skin at bedtime.    Yes [provider]  lisinopril (PRINIVIL,ZESTRIL) 20 MG tablet Take 20 mg by mouth daily.   Yes [provider]  metFORMIN (GLUCOPHAGE) 500 MG tablet Take 500 mg by mouth 2 (two) times daily with a meal.   Yes [provider]  Multiple Vitamins-Minerals (MULTIVITAMIN GUMMIES ADULT PO) Take by mouth.   Yes [provider]  simvastatin (ZOCOR) 40 MG tablet Take 40 mg by mouth daily.   Yes [provider]    Physical Exam Vitals: Blood pressure 140/80, pulse 60, height 5\' 3"  (1.6 m), weight 166 lb (75.3 kg).  General: NAD HEENT: normocephalic, anicteric Thyroid: no enlargement, no palpable nodules Pulmonary: No increased work of breathing, CTAB Cardiovascular: RRR, distal pulses 2+ Breast: Breast symmetrical, no tenderness, no palpable nodules or masses, no skin or nipple retraction present, no nipple discharge.  No axillary or supraclavicular lymphadenopathy. Abdomen: NABS, soft, non-tender, non-distended.  Umbilicus without lesions.  No hepatomegaly, splenomegaly or masses palpable. No evidence of hernia  Genitourinary: deferred for no concerns and no PAP Extremities: no edema, erythema, or tenderness Neurologic: Grossly intact Psychiatric: mood appropriate, affect full    Assessment: 58 y.o.  G2P2 routine annual exam  Plan: Problem List Items Addressed This Visit    None    Visit Diagnoses    Well woman exam without gynecological exam    -  Primary      1) Mammogram - recommend yearly screening mammogram.  Mammogram Is up to date  2) STI screening  was offered and declined  3) ASCCP guidelines and rationale discussed.  Patient opts for discontinue secondary to prior hysterectomy screening interval  4) Osteoporosis  - per USPTF routine screening DEXA at age 61   Consider FDA-approved medical therapies in postmenopausal women and men aged 80 years and older, based on the following: a) A hip or vertebral (clinical or morphometric) fracture b) T-score ? -2.5  at the femoral neck or spine after appropriate evaluation to exclude secondary causes C) Low bone mass (T-score between -1.0 and -2.5 at the femoral neck or spine) and a 10-year probability of a hip fracture ? 3% or a 10-year probability of a major osteoporosis-related fracture ? 20% based on the [place]-adapted WHO algorithm   5) Routine healthcare maintenance including cholesterol, diabetes screening discussed managed by PCP  6) Colonoscopy 2017.  Screening recommended starting at age 59 for average risk individuals, age 66 for individuals deemed at increased risk (including African Americans) and recommended to continue until age 35.  For patient age 95-85 individualized approach is recommended.  Gold standard screening is via colonoscopy, Cologuard screening is an acceptable alternative for patient unwilling or unable to undergo colonoscopy.  "Colorectal cancer screening for average?risk adults: 2018 guideline update from the American Cancer Society"[place]: A Cancer Journal for Clinicians: Sep 25, 2016   7) Return in about 1 year (around 10/29/2019) for annual established gyn.    Rod Can, [facility] 10/29/2018, 4:56 PM

## 2019-10-01 IMAGING — MG DIGITAL SCREENING BILATERAL MAMMOGRAM WITH TOMO AND CAD
8 series · 8 of 24 positions shown · non-contrast
Comparison: Previous exam(s).

CLINICAL DATA: Screening.

EXAM:
DIGITAL SCREENING BILATERAL MAMMOGRAM WITH TOMO AND CAD

[L CC synth-2D]
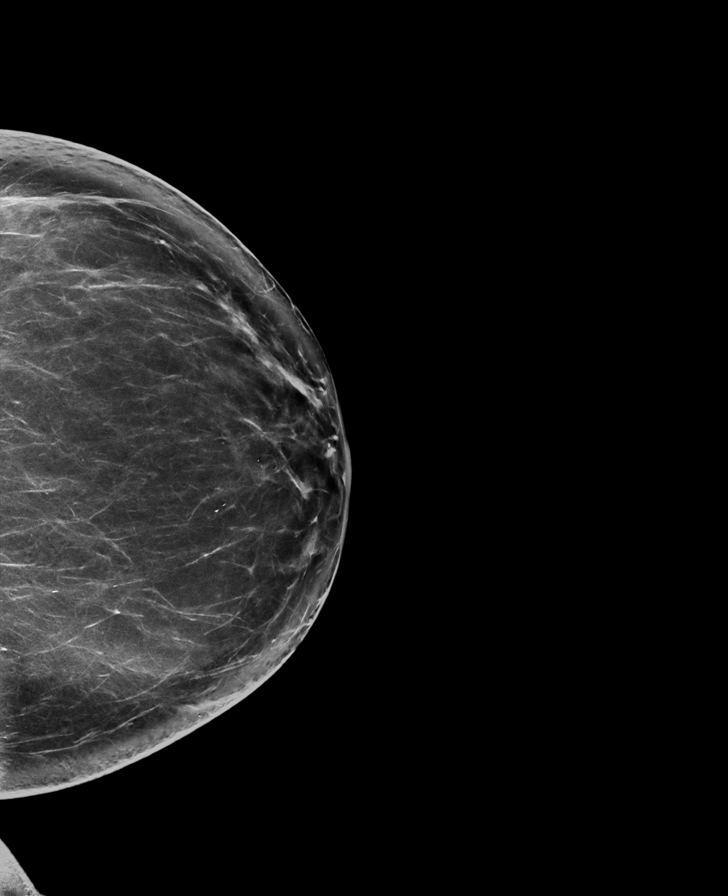

[R MLO synth-2D]
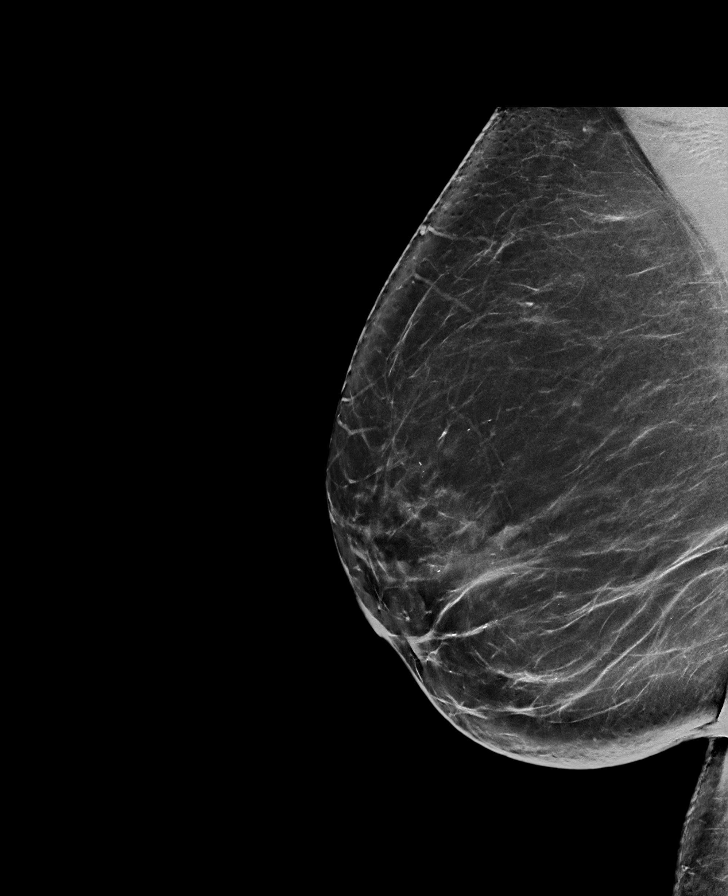

[L MLO synth-2D]
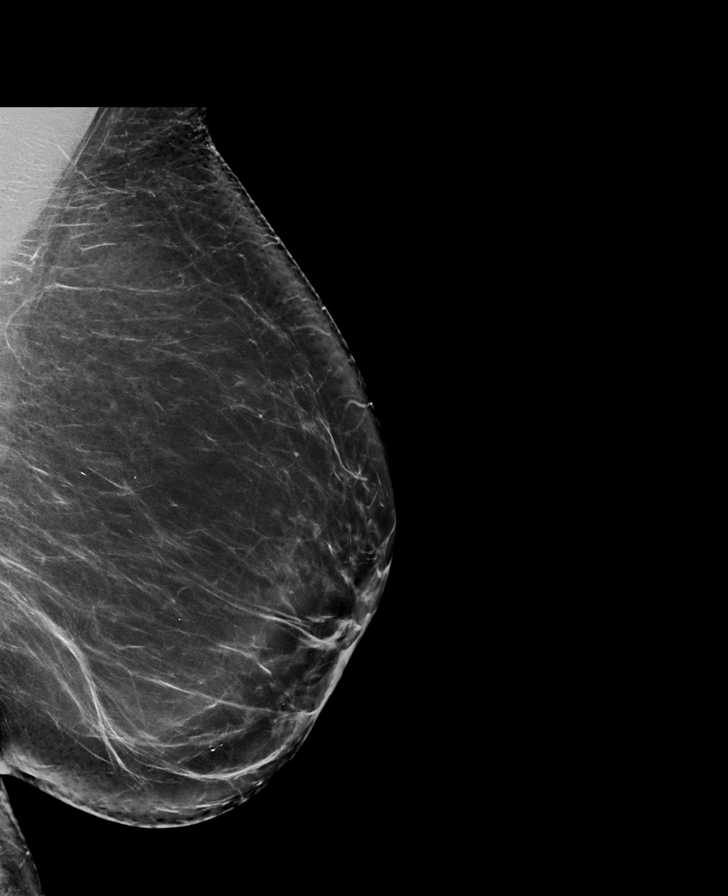

[R CC synth-2D]
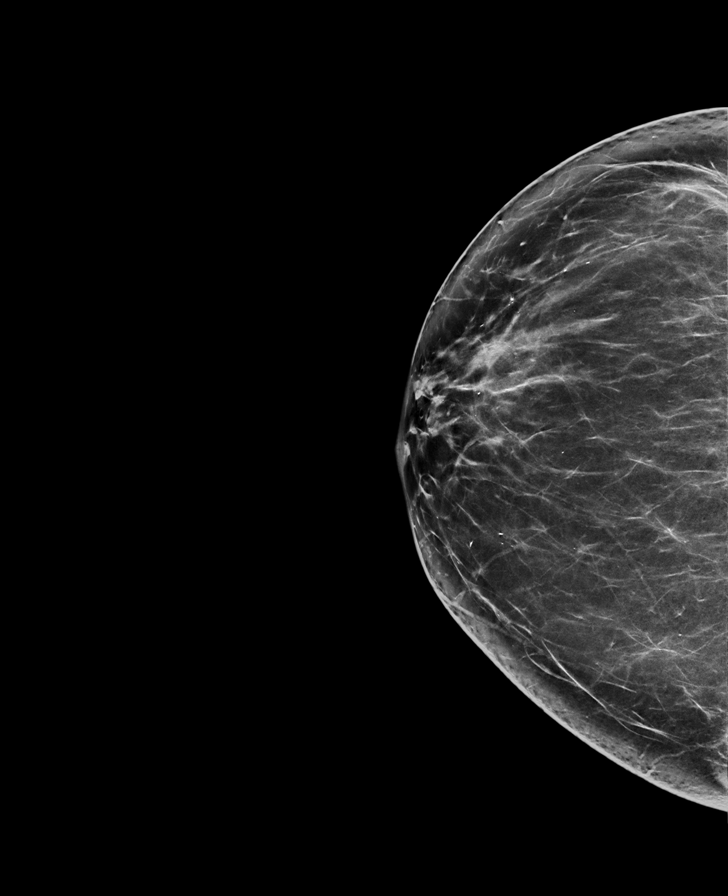

[L CC tomo · tomo slice 46/91.0]
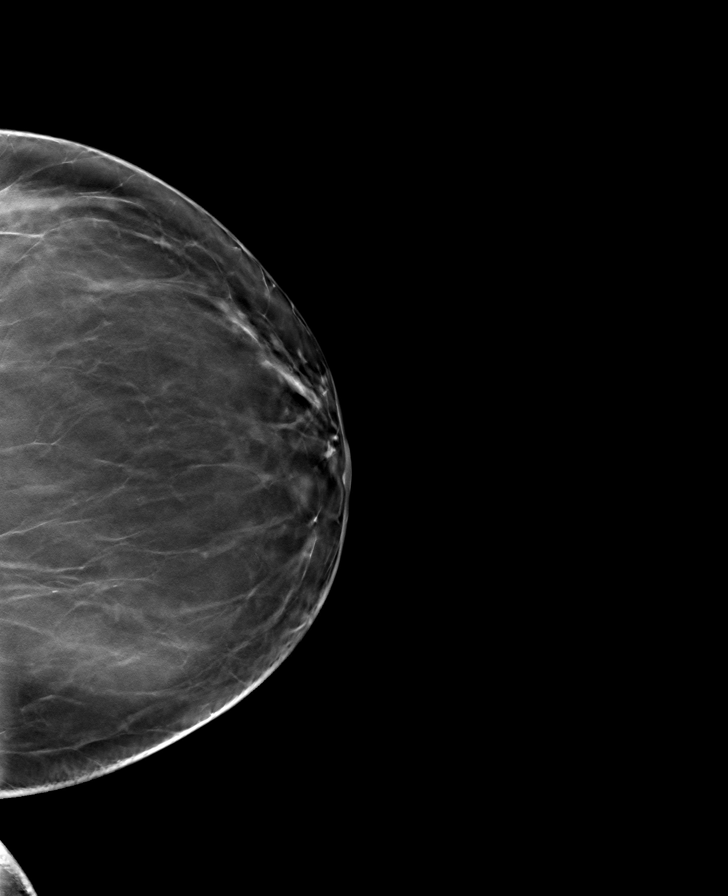

[R MLO tomo · tomo slice 47/93.0]
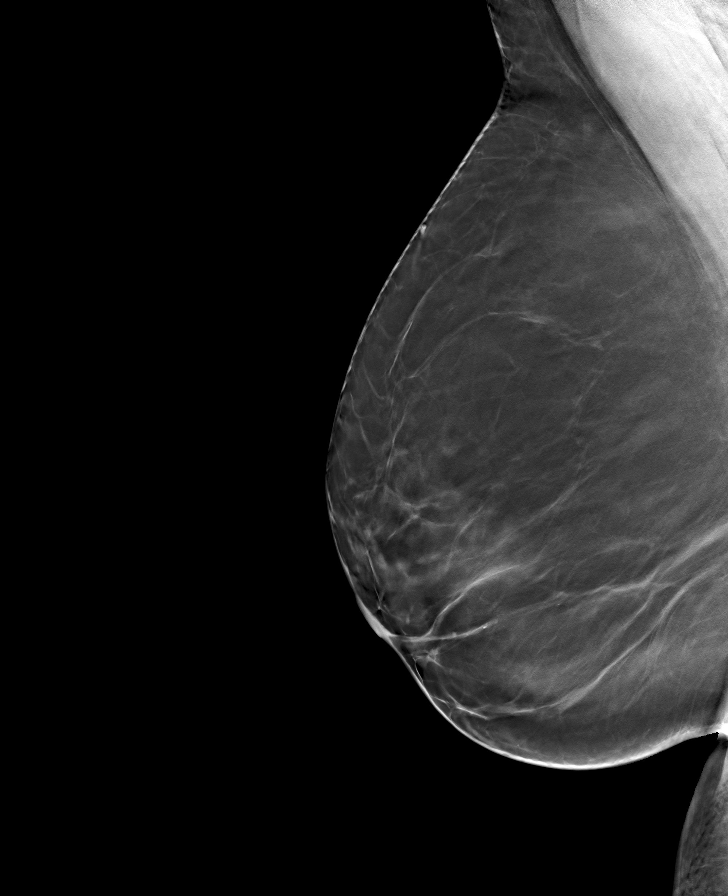

[L MLO tomo · tomo slice 49/98.0]
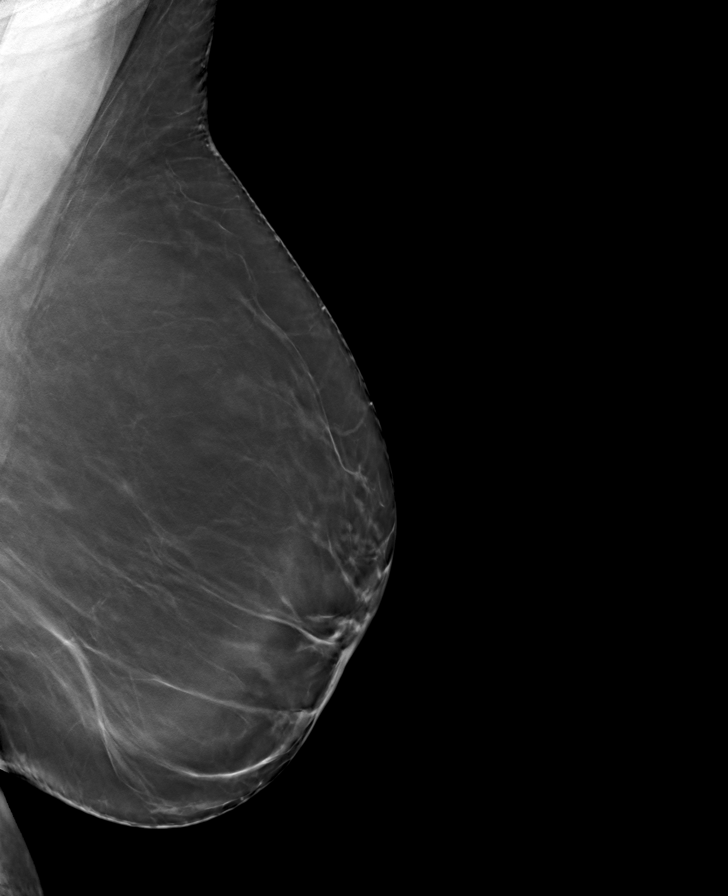

[R CC tomo · tomo slice 41/82.0]
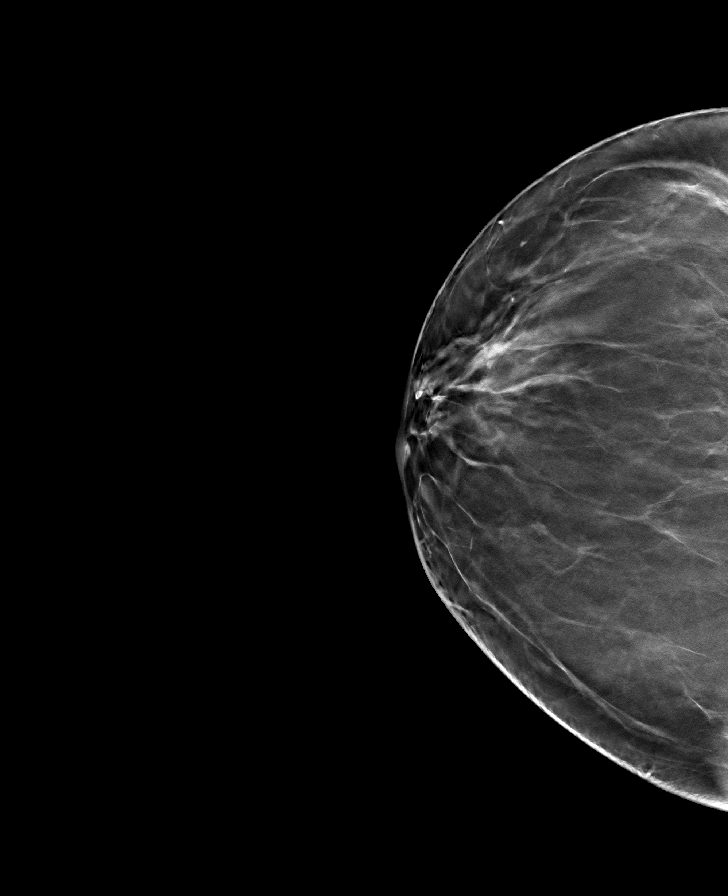

[8 of 24 positions shown; findings below may reference images not displayed]

ACR Breast Density Category b: There are scattered areas of
fibroglandular density.
FINDINGS: There are no findings suspicious for malignancy. Images were
processed with CAD.
IMPRESSION: No mammographic evidence of malignancy. A result letter of this
screening mammogram will be mailed directly to the patient.

RECOMMENDATION:
Screening mammogram in one year. (Code:[id number])

BI-RADS CATEGORY  1: Negative.

## 2019-11-03 ENCOUNTER — Ambulatory Visit: Payer: BC Managed Care – PPO | Admitting: Advanced Practice Midwife

## 2019-11-23 ENCOUNTER — Other Ambulatory Visit: Payer: Self-pay | Admitting: Internal Medicine

## 2019-11-23 DIAGNOSIS — Z1231 Encounter for screening mammogram for malignant neoplasm of breast: Secondary | ICD-10-CM

## 2019-12-14 ENCOUNTER — Ambulatory Visit: Payer: BC Managed Care – PPO

## 2019-12-22 ENCOUNTER — Ambulatory Visit: Payer: BC Managed Care – PPO

## 2019-12-23 ENCOUNTER — Other Ambulatory Visit: Payer: Self-pay

## 2019-12-23 ENCOUNTER — Encounter: Payer: Self-pay | Admitting: Emergency Medicine

## 2019-12-23 ENCOUNTER — Ambulatory Visit
Admission: EM | Admit: 2019-12-23 | Discharge: 2019-12-23 | Disposition: A | Discharge status: Home / Self Care | Payer: BC Managed Care – PPO | Attending: Family Medicine | Admitting: Family Medicine

## 2019-12-23 DIAGNOSIS — R197 Diarrhea, unspecified: Secondary | ICD-10-CM | POA: Diagnosis not present

## 2019-12-23 DIAGNOSIS — Z7982 Long term (current) use of aspirin: Secondary | ICD-10-CM | POA: Diagnosis not present

## 2019-12-23 DIAGNOSIS — E119 Type 2 diabetes mellitus without complications: Secondary | ICD-10-CM | POA: Diagnosis not present

## 2019-12-23 DIAGNOSIS — Z794 Long term (current) use of insulin: Secondary | ICD-10-CM | POA: Insufficient documentation

## 2019-12-23 DIAGNOSIS — Z79899 Other long term (current) drug therapy: Secondary | ICD-10-CM | POA: Insufficient documentation

## 2019-12-23 DIAGNOSIS — U071 COVID-19: Secondary | ICD-10-CM | POA: Insufficient documentation

## 2019-12-23 DIAGNOSIS — B349 Viral infection, unspecified: Secondary | ICD-10-CM

## 2019-12-23 DIAGNOSIS — E785 Hyperlipidemia, unspecified: Secondary | ICD-10-CM | POA: Insufficient documentation

## 2019-12-23 DIAGNOSIS — R42 Dizziness and giddiness: Secondary | ICD-10-CM | POA: Diagnosis not present

## 2019-12-23 DIAGNOSIS — I10 Essential (primary) hypertension: Secondary | ICD-10-CM | POA: Insufficient documentation

## 2019-12-23 LAB — URINALYSIS, COMPLETE (UACMP) WITH MICROSCOPIC
Bilirubin Urine: NEGATIVE
Glucose, UA: NEGATIVE mg/dL
Hgb urine dipstick: NEGATIVE
Ketones, ur: NEGATIVE mg/dL
Leukocytes,Ua: NEGATIVE
Nitrite: NEGATIVE
Protein, ur: 30 mg/dL — AB
Specific Gravity, Urine: 1.025 (ref 1.005–1.030)
pH: 5.5 (ref 5.0–8.0)

## 2019-12-23 LAB — SARS CORONAVIRUS 2 (TAT 6-24 HRS): SARS Coronavirus 2: POSITIVE — AB

## 2019-12-23 LAB — GLUCOSE, CAPILLARY: Glucose-Capillary: 177 mg/dL — ABNORMAL HIGH (ref 70–99)

## 2019-12-23 NOTE — ED Triage Notes (Signed)
Patient in today c/o dizziness, loss of appetite and diarrhea x 1 week. Patient denies fever. Patient has not had a covid vaccine. Patient has tried OTC Dayquil and [employer], [employer] Plus with some relief.

## 2019-12-23 NOTE — Discharge Instructions (Signed)
Your exam today is consistent with a viral illness. Antibiotics are not indicated at this time. COVID test sent out. Use medications as directed, including cough syrup if needed, nasal saline, and decongestants. Your symptoms should improve over the next few days and resolve within 7-10 days. Increase rest and fluids. Take Imodium if not having fever or blood in stool. F/u if symptoms worsen or predominate such as sore throat, ear pain, productive cough, shortness of breath, or if you develop high fevers or worsening fatigue over the next several days.    You have received COVID testing today either for positive exposure, concerning symptoms that could be related to COVID infection, screening purposes, or re-testing after confirmed positive.  Your test obtained today checks for active viral infection in the last 1-2 weeks. If your test is negative now, you can still test positive later. So, if you do develop symptoms you should either get re-tested and/or isolate x 10 days. Please follow CDC guidelines.  While Rapid antigen tests come back in 15-20 minutes, send out PCR/molecular test results typically come back within 24 hours. In the mean time, if you are symptomatic, assume this could be a positive test and treat/monitor yourself as if you do have COVID.   We will call with test results. Please download the MyChart app and set up a profile to access test results.   If symptomatic, go home and rest. Push fluids. Take Tylenol as needed for discomfort. Gargle warm salt water. Throat lozenges. Take Mucinex DM or Robitussin for cough. Humidifier in bedroom to ease coughing. Warm showers. Also review the COVID handout for more information.  COVID-19 INFECTION: The incubation period of COVID-19 is approximately 14 days after exposure, with most symptoms developing in roughly 4-5 days. Symptoms may range in severity from mild to critically severe. Roughly 80% of those infected will have mild symptoms. People  of any age may become infected with COVID-19 and have the ability to transmit the virus. The most common symptoms include: fever, fatigue, cough, body aches, headaches, sore throat, nasal congestion, shortness of breath, nausea, vomiting, diarrhea, changes in smell and/or taste.    COURSE OF ILLNESS Some patients may begin with mild disease which can progress quickly into critical symptoms. If your symptoms are worsening please call ahead to the Emergency Department and proceed there for further treatment. Recovery time appears to be roughly 1-2 weeks for mild symptoms and 3-6 weeks for severe disease.   GO IMMEDIATELY TO ER FOR FEVER YOU ARE UNABLE TO GET DOWN WITH TYLENOL, BREATHING PROBLEMS, CHEST PAIN, FATIGUE, LETHARGY, INABILITY TO EAT OR DRINK, ETC  QUARANTINE AND ISOLATION: To help decrease the spread of COVID-19 please remain isolated if you have COVID infection or are highly suspected to have COVID infection. This means -stay home and isolate to one room in the home if you live with others. Do not share a bed or bathroom with others while ill, sanitize and wipe down all countertops and keep common areas clean and disinfected. You may discontinue isolation if you have a mild case and are asymptomatic 10 days after symptom onset as long as you have been fever free >24 hours without having to take Motrin or Tylenol. If your case is more severe (meaning you develop pneumonia or are admitted in the hospital), you may have to isolate longer.   If you have been in close contact (within 6 feet) of someone diagnosed with COVID 19, you are advised to quarantine in your home for  14 days as symptoms can develop anywhere from 2-14 days after exposure to the virus. If you develop symptoms, you  must isolate.  Most current guidelines for COVID after exposure -isolate 10 days if you ARE NOT tested for COVID as long as symptoms do not develop -isolate 7 days if you are tested and remain asymptomatic -You do not  necessarily need to be tested for COVID if you have + exposure and        develop   symptoms. Just isolate at home x10 days from symptom onset During this global pandemic, CDC advises to practice social distancing, try to stay at least 63ft away from others at all times. Wear a face covering. Wash and sanitize your hands regularly and avoid going anywhere that is not necessary.  KEEP IN MIND THAT THE COVID TEST IS NOT 100% ACCURATE AND YOU SHOULD STILL DO EVERYTHING TO PREVENT POTENTIAL SPREAD OF VIRUS TO OTHERS (WEAR MASK, WEAR GLOVES, [place] HANDS AND SANITIZE REGULARLY). IF INITIAL TEST IS NEGATIVE, THIS MAY NOT MEAN YOU ARE DEFINITELY NEGATIVE. MOST ACCURATE TESTING IS DONE 5-7 DAYS AFTER EXPOSURE.   It is not advised by CDC to get re-tested after receiving a positive COVID test since you can still test positive for weeks to months after you have already cleared the virus.   *If you have not been vaccinated for COVID, I strongly suggest you consider getting vaccinated as long as there are no contraindications.

## 2019-12-23 NOTE — ED Provider Notes (Signed)
[facility]    CSN: [id number] Arrival date & time: 12/23/19  8588      History   Chief Complaint Chief Complaint  Patient presents with  . Dizziness  . Anorexia  . Diarrhea    HPI Denise Madden is a 59 y.o. female.   59 y/o female presents for 1 week history of loss of appetite, feeling dizzy upon standing, and having diarrhea. She says she started with a cough, but that went away with [place]. Denies chest tightness/pain, SOB. Patient admits to diarrhea 3 times yesterday, but none today. Denies nausea/vomiting. Denies abdominal pain. She says she is still able to drink fluids. Denies headaches, vision changes, numbness/tingling. Patient admits to COVID exposure about 10 days ago. Patient not taking any medication for symptoms other than alka seltzer. Patient has no other complaints or concerns today.  Patient has a history of diabetes, hypertension, and hyperlipidemia.     Past Medical History:  Diagnosis Date  . Diabetes mellitus without complication ([place])   . Hemorrhoids   . Hyperlipidemia   . Hypertension     There are no problems to display for this patient.   Past Surgical History:  Procedure Laterality Date  . ABDOMINAL HYSTERECTOMY    . CESAREAN SECTION    . COLONOSCOPY  2007   [facility]  . COLONOSCOPY WITH PROPOFOL N/A 12/20/2015   Procedure: COLONOSCOPY WITH PROPOFOL;  Surgeon: Christene Lye, MD;  Location: [facility] ENDOSCOPY;  Service: Endoscopy;  Laterality: N/A;  . HAND SURGERY Left     OB History    Gravida  2   Para  2   Term      Preterm      AB      Living  2     SAB      TAB      Ectopic      Multiple      Live Births           Obstetric Comments  1st Menstrual Cycle:  9 1st Pregnancy:  17          Home Medications    Prior to Admission medications   Medication Sig Start Date End Date Taking? Authorizing Provider  amLODipine-atorvastatin (CADUET) 5-40 MG tablet Take 1 tablet by mouth at  bedtime. 12/07/19  Yes [provider]  aspirin 81 MG tablet Take 81 mg by mouth daily.   Yes [provider]  folic acid (FOLVITE) 502 MCG tablet Take 400 mcg by mouth daily.   Yes [provider]  insulin glargine (LANTUS) 100 UNIT/ML injection Inject 60 Units into the skin at bedtime.    Yes [provider]  metFORMIN (GLUCOPHAGE) 500 MG tablet Take 500 mg by mouth 2 (two) times daily with a meal.   Yes [provider]  Multiple Vitamins-Minerals (MULTIVITAMIN GUMMIES ADULT PO) Take by mouth.   Yes [provider]  valsartan-hydrochlorothiazide (DIOVAN-HCT) 160-12.5 MG tablet Take 1 tablet by mouth daily. 12/07/19  Yes [provider]  amLODipine (NORVASC) 5 MG tablet  08/10/15   [provider]  atorvastatin (LIPITOR) 20 MG tablet  08/10/15   [provider]  ezetimibe (ZETIA) 10 MG tablet Take 10 mg by mouth daily.    [provider]  lisinopril (PRINIVIL,ZESTRIL) 20 MG tablet Take 20 mg by mouth daily.    [provider]  simvastatin (ZOCOR) 40 MG tablet Take 40 mg by mouth daily.    [provider]  Family History Family History  Problem Relation Age of Onset  . Diabetes Mother   . Melanoma Father   . Prostate cancer Maternal Grandfather   . Breast cancer Paternal Aunt 38    Social History Social History   Tobacco Use  . Smoking status: Never Smoker  . Smokeless tobacco: Never Used  Vaping Use  . Vaping Use: Never used  Substance Use Topics  . Alcohol use: No    Alcohol/week: 0.0 standard drinks  . Drug use: No     Allergies   Patient has no known allergies.   Review of Systems Review of Systems  Constitutional: Positive for appetite change and fatigue. Negative for chills, diaphoresis and fever.  HENT: Negative for congestion, ear pain, rhinorrhea, sinus pressure, sinus pain and sore throat.   Respiratory: Negative for cough and shortness of breath.     Cardiovascular: Negative for chest pain.  Gastrointestinal: Positive for diarrhea. Negative for abdominal pain, nausea and vomiting.  Musculoskeletal: Negative for arthralgias and myalgias.  Skin: Negative for rash.  Neurological: Positive for dizziness. Negative for weakness and headaches.  Hematological: Negative for adenopathy.     Physical Exam Triage Vital Signs ED Triage Vitals  Enc Vitals Group     BP 12/23/19 0835 109/62     Pulse Rate 12/23/19 0835 85     Resp 12/23/19 0835 18     Temp 12/23/19 0835 99.4 F (37.4 C)     Temp Source 12/23/19 0835 Oral     SpO2 12/23/19 0835 97 %     Weight 12/23/19 0834 165 lb (74.8 kg)     Height 12/23/19 0834 5\' 3"  (1.6 m)     Head Circumference --      Peak Flow --      Pain Score 12/23/19 0834 0     Pain Loc --      Pain Edu? --      Excl. in [place]? --    No data found.  Updated Vital Signs BP 109/62 (BP Location: Left Arm)   Pulse 85   Temp 99.4 F (37.4 C) (Oral)   Resp 18   Ht 5\' 3"  (1.6 m)   Wt 165 lb (74.8 kg)   SpO2 97%   BMI 29.23 kg/m       Physical Exam Vitals and nursing note reviewed.  Constitutional:      General: She is not in acute distress.    Appearance: Normal appearance. She is not ill-appearing or toxic-appearing.  HENT:     Head: Normocephalic and atraumatic.     Right Ear: Tympanic membrane, ear canal and external ear normal.     Left Ear: Tympanic membrane, ear canal and external ear normal.     Nose: Nose normal.     Mouth/Throat:     Mouth: Mucous membranes are moist.     Pharynx: Oropharynx is clear.  Eyes:     General: No scleral icterus.       Right eye: No discharge.        Left eye: No discharge.     Conjunctiva/sclera: Conjunctivae normal.  Cardiovascular:     Rate and Rhythm: Normal rate and regular rhythm.     Heart sounds: Normal heart sounds.  Pulmonary:     Effort: Pulmonary effort is normal. No respiratory distress.     Breath sounds: Normal breath sounds.  Abdominal:      General: Bowel sounds are normal. There is no distension.  Palpations: Abdomen is soft.     Tenderness: There is no abdominal tenderness. There is no guarding or rebound.  Musculoskeletal:     Cervical back: Neck supple.  Skin:    General: Skin is dry.  Neurological:     General: No focal deficit present.     Mental Status: She is alert. Mental status is at baseline.     Motor: No weakness.     Gait: Gait normal.  Psychiatric:        Mood and Affect: Mood normal.        Behavior: Behavior normal.        Thought Content: Thought content normal.      UC Treatments / Results  Labs (all labs ordered are listed, but only abnormal results are displayed) Labs Reviewed  URINALYSIS, COMPLETE (UACMP) WITH MICROSCOPIC - Abnormal; Notable for the following components:      Result Value   Color, Urine AMBER (*)    Protein, ur 30 (*)    Bacteria, UA FEW (*)    All other components within normal limits  GLUCOSE, CAPILLARY - Abnormal; Notable for the following components:   Glucose-Capillary 177 (*)    All other components within normal limits  SARS CORONAVIRUS 2 (TAT 6-24 HRS)  CBG MONITORING, ED    EKG   Radiology No results found.  Procedures Procedures (including critical care time)  Medications Ordered in UC Medications - No data to display  Initial Impression / Assessment and Plan / UC Course  I have reviewed the triage vital signs and the nursing notes.  Pertinent labs & imaging results that were available during my care of the patient were reviewed by me and considered in my medical decision making (see chart for details).   59 y/o female presenting with diarrhea, dizziness, and loss of appetite. UA with  1.025 sp gravity, no ketones. BS 177. She declines workup other than COVID testing today. Requests work note. Declines EKG, chest x-ray, lab work, and IV fluids. Advised resting at home and pushing fluids. Return for new/worsening symptoms or if not feeling  better in the next 2-3 days.   Final Clinical Impressions(s) / UC Diagnoses   Final diagnoses:  Viral illness  Diarrhea, unspecified type  Dizziness     Discharge Instructions     Your exam today is consistent with a viral illness. Antibiotics are not indicated at this time. COVID test sent out. Use medications as directed, including cough syrup if needed, nasal saline, and decongestants. Your symptoms should improve over the next few days and resolve within 7-10 days. Increase rest and fluids. Take Imodium if not having fever or blood in stool. F/u if symptoms worsen or predominate such as sore throat, ear pain, productive cough, shortness of breath, or if you develop high fevers or worsening fatigue over the next several days.    You have received COVID testing today either for positive exposure, concerning symptoms that could be related to COVID infection, screening purposes, or re-testing after confirmed positive.  Your test obtained today checks for active viral infection in the last 1-2 weeks. If your test is negative now, you can still test positive later. So, if you do develop symptoms you should either get re-tested and/or isolate x 10 days. Please follow CDC guidelines.  While Rapid antigen tests come back in 15-20 minutes, send out PCR/molecular test results typically come back within 24 hours. In the mean time, if you are symptomatic, assume this could be a  positive test and treat/monitor yourself as if you do have COVID.   We will call with test results. Please download the MyChart app and set up a profile to access test results.   If symptomatic, go home and rest. Push fluids. Take Tylenol as needed for discomfort. Gargle warm salt water. Throat lozenges. Take Mucinex DM or Robitussin for cough. Humidifier in bedroom to ease coughing. Warm showers. Also review the COVID handout for more information.  COVID-19 INFECTION: The incubation period of COVID-19 is approximately 14 days  after exposure, with most symptoms developing in roughly 4-5 days. Symptoms may range in severity from mild to critically severe. Roughly 80% of those infected will have mild symptoms. People of any age may become infected with COVID-19 and have the ability to transmit the virus. The most common symptoms include: fever, fatigue, cough, body aches, headaches, sore throat, nasal congestion, shortness of breath, nausea, vomiting, diarrhea, changes in smell and/or taste.    COURSE OF ILLNESS Some patients may begin with mild disease which can progress quickly into critical symptoms. If your symptoms are worsening please call ahead to the Emergency Department and proceed there for further treatment. Recovery time appears to be roughly 1-2 weeks for mild symptoms and 3-6 weeks for severe disease.   GO IMMEDIATELY TO ER FOR FEVER YOU ARE UNABLE TO GET DOWN WITH TYLENOL, BREATHING PROBLEMS, CHEST PAIN, FATIGUE, LETHARGY, INABILITY TO EAT OR DRINK, ETC  QUARANTINE AND ISOLATION: To help decrease the spread of COVID-19 please remain isolated if you have COVID infection or are highly suspected to have COVID infection. This means -stay home and isolate to one room in the home if you live with others. Do not share a bed or bathroom with others while ill, sanitize and wipe down all countertops and keep common areas clean and disinfected. You may discontinue isolation if you have a mild case and are asymptomatic 10 days after symptom onset as long as you have been fever free >24 hours without having to take Motrin or Tylenol. If your case is more severe (meaning you develop pneumonia or are admitted in the hospital), you may have to isolate longer.   If you have been in close contact (within 6 feet) of someone diagnosed with COVID 19, you are advised to quarantine in your home for 14 days as symptoms can develop anywhere from 2-14 days after exposure to the virus. If you develop symptoms, you  must isolate.  Most current  guidelines for COVID after exposure -isolate 10 days if you ARE NOT tested for COVID as long as symptoms do not develop -isolate 7 days if you are tested and remain asymptomatic -You do not necessarily need to be tested for COVID if you have + exposure and        develop   symptoms. Just isolate at home x10 days from symptom onset During this global pandemic, CDC advises to practice social distancing, try to stay at least 58ft away from others at all times. Wear a face covering. Wash and sanitize your hands regularly and avoid going anywhere that is not necessary.  KEEP IN MIND THAT THE COVID TEST IS NOT 100% ACCURATE AND YOU SHOULD STILL DO EVERYTHING TO PREVENT POTENTIAL SPREAD OF VIRUS TO OTHERS (WEAR MASK, WEAR GLOVES, [place] HANDS AND SANITIZE REGULARLY). IF INITIAL TEST IS NEGATIVE, THIS MAY NOT MEAN YOU ARE DEFINITELY NEGATIVE. MOST ACCURATE TESTING IS DONE 5-7 DAYS AFTER EXPOSURE.   It is not advised by CDC to get  re-tested after receiving a positive COVID test since you can still test positive for weeks to months after you have already cleared the virus.   *If you have not been vaccinated for COVID, I strongly suggest you consider getting vaccinated as long as there are no contraindications.      ED Prescriptions    None     PDMP not reviewed this encounter.   Danton Clap, PA-C 12/23/19 [phone]

## 2019-12-25 ENCOUNTER — Telehealth: Payer: Self-pay | Admitting: Family

## 2019-12-25 NOTE — Telephone Encounter (Signed)
Called to Discuss with patient about Covid symptoms and the use of the monoclonal antibody infusion for those with mild to moderate Covid symptoms and at a high risk of hospitalization.     Pt appears to qualify for this infusion due to co-morbid conditions and/or a member of an at-risk group in accordance with the FDA Emergency Use Authorization.    Unable to reach pt. Left message with hotline number. Qualifying risks include hypertension, BMI >25, and Diabetes.  Terri Piedra, NP

## 2020-02-15 ENCOUNTER — Ambulatory Visit (INDEPENDENT_AMBULATORY_CARE_PROVIDER_SITE_OTHER): Payer: BC Managed Care – PPO

## 2020-02-15 ENCOUNTER — Ambulatory Visit
Admission: EM | Admit: 2020-02-15 | Discharge: 2020-02-15 | Disposition: A | Discharge status: Home / Self Care | Payer: BC Managed Care – PPO | Attending: Emergency Medicine | Admitting: Emergency Medicine

## 2020-02-15 ENCOUNTER — Other Ambulatory Visit: Payer: Self-pay

## 2020-02-15 DIAGNOSIS — R0600 Dyspnea, unspecified: Secondary | ICD-10-CM | POA: Diagnosis not present

## 2020-02-15 DIAGNOSIS — J069 Acute upper respiratory infection, unspecified: Secondary | ICD-10-CM

## 2020-02-15 DIAGNOSIS — R059 Cough, unspecified: Secondary | ICD-10-CM

## 2020-02-15 MED ORDER — BENZONATATE 100 MG PO CAPS: 100.0000 mg | ORAL_CAPSULE | Freq: Three times a day (TID) | ORAL | 0 refills | Status: AC

## 2020-02-15 MED ORDER — PROMETHAZINE-DM 6.25-15 MG/5ML PO SYRP: 5.0000 mL | ORAL_SOLUTION | Freq: Four times a day (QID) | ORAL | 0 refills | Status: AC | PRN

## 2020-02-15 NOTE — Discharge Instructions (Addendum)
Use your inhaler, 2 puffs every 4-6 hours, as needed for shortness of breath and cough.  Use the Tessalon Perles every 8 hours as needed for cough and use the Promethazine DM at bedtime.  Return or follow-up with your primary care doctor for new or worsening symptoms.

## 2020-02-15 NOTE — ED Triage Notes (Signed)
Patient states that she has been having a cough x 3 days. States that she was diagnosed with covid on 08/26 and had the antibody treatment. States that has been unable to kick this cough.

## 2020-02-15 NOTE — ED Provider Notes (Signed)
[facility]    CSN: [id number] Arrival date & time: 02/15/20  1517      History   Chief Complaint Chief Complaint  Patient presents with   Cough    HPI Denise Madden is a 59 y.o. female.   59 year old female presents for evaluation of cough.  Patient reports that she has had a cough since she was diagnosed with Covid on 12/23/2019.  She received a monoclonal antibody treatment for that.  She also reports that she developed Covid pneumonia at that time.  She reports that she has had a slight fever, wheezing, shortness of breath, runny nose and nasal congestion, clear nasal discharge, and productive cough for a white to yellow sputum.  She denies sore throat, nausea, vomiting, or diarrhea.  She reports that she had an repeat chest x-ray 2 or more weeks ago that showed residual fluid versus scarring in both bases.     Past Medical History:  Diagnosis Date   Diabetes mellitus without complication ([place])    Hemorrhoids    Hyperlipidemia    Hypertension     There are no problems to display for this patient.   Past Surgical History:  Procedure Laterality Date   ABDOMINAL HYSTERECTOMY     CESAREAN SECTION     COLONOSCOPY  2007   [facility]   COLONOSCOPY WITH PROPOFOL N/A 12/20/2015   Procedure: COLONOSCOPY WITH PROPOFOL;  Surgeon: Christene Lye, MD;  Location: [facility] ENDOSCOPY;  Service: Endoscopy;  Laterality: N/A;   HAND SURGERY Left     OB History    Gravida  2   Para  2   Term      Preterm      AB      Living  2     SAB      TAB      Ectopic      Multiple      Live Births           Obstetric Comments  1st Menstrual Cycle:  9 1st Pregnancy:  17          Home Medications    Prior to Admission medications   Medication Sig Start Date End Date Taking? Authorizing Provider  amLODipine-atorvastatin (CADUET) 5-40 MG tablet Take 1 tablet by mouth at bedtime. 12/07/19  Yes [provider]  aspirin 81 MG tablet Take  81 mg by mouth daily.   Yes [provider]  folic acid (FOLVITE) 465 MCG tablet Take 400 mcg by mouth daily.   Yes [provider]  insulin glargine (LANTUS) 100 UNIT/ML injection Inject 60 Units into the skin at bedtime.    Yes [provider]  metFORMIN (GLUCOPHAGE) 500 MG tablet Take 500 mg by mouth 2 (two) times daily with a meal.   Yes [provider]  Multiple Vitamins-Minerals (MULTIVITAMIN GUMMIES ADULT PO) Take by mouth.   Yes [provider]  valsartan-hydrochlorothiazide (DIOVAN-HCT) 160-12.5 MG tablet Take 1 tablet by mouth daily. 12/07/19  Yes [provider]  benzonatate (TESSALON) 100 MG capsule Take 1 capsule (100 mg total) by mouth every 8 (eight) hours. 02/15/20   Margarette Canada, NP  promethazine-dextromethorphan (PROMETHAZINE-DM) 6.25-15 MG/5ML syrup Take 5 mLs by mouth 4 (four) times daily as needed. 02/15/20   Margarette Canada, NP  amLODipine (NORVASC) 5 MG tablet  08/10/15 02/15/20  [provider]  atorvastatin (LIPITOR) 20 MG tablet  08/10/15 02/15/20  [provider]  ezetimibe (ZETIA) 10 MG tablet Take 10  mg by mouth daily.  02/15/20  [provider]  lisinopril (PRINIVIL,ZESTRIL) 20 MG tablet Take 20 mg by mouth daily.  02/15/20  [provider]  simvastatin (ZOCOR) 40 MG tablet Take 40 mg by mouth daily.  02/15/20  [provider]    Family History Family History  Problem Relation Age of Onset   Diabetes Mother    Melanoma Father    Prostate cancer Maternal Grandfather    Breast cancer Paternal Aunt 75    Social History Social History   Tobacco Use   Smoking status: Never Smoker   Smokeless tobacco: Never Used  Scientific laboratory technician Use: Never used  Substance Use Topics   Alcohol use: No    Alcohol/week: 0.0 standard drinks   Drug use: No     Allergies   Patient has no known allergies.   Review of Systems Review of Systems  Constitutional: Positive  for fever. Negative for activity change and appetite change.  HENT: Positive for congestion and rhinorrhea. Negative for ear discharge, ear pain, sinus pressure, sinus pain and sore throat.   Respiratory: Positive for cough and wheezing.   Cardiovascular: Negative for chest pain.  Gastrointestinal: Negative for diarrhea, nausea and vomiting.  Genitourinary: Negative for dysuria and frequency.  Musculoskeletal: Negative for arthralgias and myalgias.  Skin: Negative for rash.  Neurological: Negative for syncope and headaches.  Hematological: Negative.   Psychiatric/Behavioral: Negative.      Physical Exam Triage Vital Signs ED Triage Vitals  Enc Vitals Group     BP 02/15/20 1548 (!) 188/73     Pulse Rate 02/15/20 1548 (!) 57     Resp 02/15/20 1548 18     Temp 02/15/20 1548 98.9 F (37.2 C)     Temp Source 02/15/20 1548 Oral     SpO2 02/15/20 1548 100 %     Weight 02/15/20 1545 160 lb (72.6 kg)     Height 02/15/20 1545 5\' 3"  (1.6 m)     Head Circumference --      Peak Flow --      Pain Score 02/15/20 1544 0     Pain Loc --      Pain Edu? --      Excl. in [place]? --    No data found.  Updated Vital Signs BP (!) 188/73 (BP Location: Left Arm)    Pulse (!) 57    Temp 98.9 F (37.2 C) (Oral)    Resp 18    Ht 5\' 3"  (1.6 m)    Wt 160 lb (72.6 kg)    SpO2 100%    BMI 28.34 kg/m   Visual Acuity Right Eye Distance:   Left Eye Distance:   Bilateral Distance:    Right Eye Near:   Left Eye Near:    Bilateral Near:     Physical Exam Vitals and nursing note reviewed.  Constitutional:      General: She is not in acute distress.    Appearance: Normal appearance. She is not ill-appearing.  HENT:     Head: Normocephalic and atraumatic.     Right Ear: Tympanic membrane, ear canal and external ear normal. There is no impacted cerumen.     Left Ear: Tympanic membrane, ear canal and external ear normal. There is no impacted cerumen.     Nose: Congestion and rhinorrhea present.      Comments: Nasal mucosa is edematous bilaterally.  There is mild erythema and clear nasal discharge.  Mouth/Throat:     Mouth: Mucous membranes are moist.     Pharynx: Oropharynx is clear. No oropharyngeal exudate or posterior oropharyngeal erythema.     Comments: Clear postnasal drip in posterior oropharynx.  No erythema. Eyes:     General: No scleral icterus.    Extraocular Movements: Extraocular movements intact.     Conjunctiva/sclera: Conjunctivae normal.     Pupils: Pupils are equal, round, and reactive to light.  Cardiovascular:     Rate and Rhythm: Normal rate and regular rhythm.     Pulses: Normal pulses.     Heart sounds: Normal heart sounds. No murmur heard.  No gallop.   Pulmonary:     Effort: Pulmonary effort is normal.     Breath sounds: No wheezing, rhonchi or rales.     Comments: Patient has decreased breath sounds diffusely. Musculoskeletal:        General: No swelling. Normal range of motion.     Cervical back: Normal range of motion and neck supple.  Lymphadenopathy:     Cervical: No cervical adenopathy.  Skin:    General: Skin is warm and dry.     Capillary Refill: Capillary refill takes less than 2 seconds.     Findings: No erythema or rash.  Neurological:     General: No focal deficit present.     Mental Status: She is alert and oriented to person, place, and time.  Psychiatric:        Mood and Affect: Mood normal.        Behavior: Behavior normal.        Thought Content: Thought content normal.        Judgment: Judgment normal.      UC Treatments / Results  Labs (all labs ordered are listed, but only abnormal results are displayed) Labs Reviewed - No data to display  EKG   Radiology DG Chest 2 View  Result Date: 02/15/2020 CLINICAL DATA:  Cough, dyspnea EXAM: CHEST - 2 VIEW COMPARISON:  None. FINDINGS: Lungs are clear. No pneumothorax or pleural effusion. Cardiac size within normal limits. Pulmonary vascularity is normal. Mild thoracic  sigmoid scoliosis noted. No acute bone abnormality. IMPRESSION: No active cardiopulmonary disease. Electronically Signed   By: Fidela Salisbury MD   On: 02/15/2020 16:24    Procedures Procedures (including critical care time)  Medications Ordered in UC Medications - No data to display  Initial Impression / Assessment and Plan / UC Course  I have reviewed the triage vital signs and the nursing notes.  Pertinent labs & imaging results that were available during my care of the patient were reviewed by me and considered in my medical decision making (see chart for details).   Patient presents for evaluation of a cough.  She reports that she has had a continuous cough since being diagnosed with Covid in August.  The cough has become worse in the last 3 days and is now productive for a white to yellow sputum.  She has not had a sore throat or GI issues.  She does report wheezing and shortness of breath.  She does have some clear nasal discharge also.  She has an inhaler at home but she has not used it.  Physical exam reveals decreased lung sounds diffusely.  Patient reports that she had a repeat chest x-ray 2+ weeks ago that showed residual fluid versus scarring in both bases of her lungs.  Will repeat chest x-ray to look for expansion of pneumonia.  If chest  x-ray is negative will DC home with Tessalon Perles and inhaler.  Radiology read of chest x-ray is negative for pneumonia.  Will DC home with albuterol and inhaler, Tessalon Perles, and Promethazine DM.   Final Clinical Impressions(s) / UC Diagnoses   Final diagnoses:  Viral URI with cough     Discharge Instructions     Use your inhaler, 2 puffs every 4-6 hours, as needed for shortness of breath and cough.  Use the Tessalon Perles every 8 hours as needed for cough and use the Promethazine DM at bedtime.  Return or follow-up with your primary care doctor for new or worsening symptoms.    ED Prescriptions    Medication Sig Dispense  Auth. Provider   benzonatate (TESSALON) 100 MG capsule Take 1 capsule (100 mg total) by mouth every 8 (eight) hours. 21 capsule Margarette Canada, NP   promethazine-dextromethorphan (PROMETHAZINE-DM) 6.25-15 MG/5ML syrup Take 5 mLs by mouth 4 (four) times daily as needed. 118 mL Margarette Canada, NP     PDMP not reviewed this encounter.   Margarette Canada, NP 02/15/20 [phone]

## 2020-07-24 ENCOUNTER — Emergency Department
Admission: EM | Admit: 2020-07-24 | Discharge: 2020-07-25 | Disposition: A | Discharge status: Home / Self Care | Payer: BC Managed Care – PPO | Attending: Emergency Medicine | Admitting: Emergency Medicine

## 2020-07-24 ENCOUNTER — Other Ambulatory Visit: Payer: Self-pay

## 2020-07-24 DIAGNOSIS — Z7982 Long term (current) use of aspirin: Secondary | ICD-10-CM | POA: Insufficient documentation

## 2020-07-24 DIAGNOSIS — Z794 Long term (current) use of insulin: Secondary | ICD-10-CM | POA: Diagnosis not present

## 2020-07-24 DIAGNOSIS — Z7984 Long term (current) use of oral hypoglycemic drugs: Secondary | ICD-10-CM | POA: Insufficient documentation

## 2020-07-24 DIAGNOSIS — L299 Pruritus, unspecified: Secondary | ICD-10-CM | POA: Insufficient documentation

## 2020-07-24 DIAGNOSIS — I10 Essential (primary) hypertension: Secondary | ICD-10-CM | POA: Diagnosis not present

## 2020-07-24 DIAGNOSIS — Z79899 Other long term (current) drug therapy: Secondary | ICD-10-CM | POA: Diagnosis not present

## 2020-07-24 DIAGNOSIS — R0989 Other specified symptoms and signs involving the circulatory and respiratory systems: Secondary | ICD-10-CM | POA: Insufficient documentation

## 2020-07-24 DIAGNOSIS — R220 Localized swelling, mass and lump, head: Secondary | ICD-10-CM | POA: Diagnosis not present

## 2020-07-24 DIAGNOSIS — T7840XA Allergy, unspecified, initial encounter: Secondary | ICD-10-CM | POA: Insufficient documentation

## 2020-07-24 DIAGNOSIS — E119 Type 2 diabetes mellitus without complications: Secondary | ICD-10-CM | POA: Insufficient documentation

## 2020-07-24 MED ORDER — EPINEPHRINE 0.3 MG/0.3ML IJ SOAJ
0.3000 mg | Freq: Once | INTRAMUSCULAR | Status: AC
  Administered 2020-07-24: 0.3 mg via INTRAMUSCULAR
  Filled 2020-07-24: qty 0.3

## 2020-07-24 MED ORDER — BENZONATATE 100 MG PO CAPS
100.0000 mg | ORAL_CAPSULE | Freq: Once | ORAL | Status: AC
  Administered 2020-07-24: 100 mg via ORAL
  Filled 2020-07-24: qty 1

## 2020-07-24 MED ORDER — METHYLPREDNISOLONE SODIUM SUCC 125 MG IJ SOLR
125.0000 mg | Freq: Once | INTRAMUSCULAR | Status: AC
  Administered 2020-07-24: 125 mg via INTRAVENOUS
  Filled 2020-07-24: qty 2

## 2020-07-24 MED ORDER — FAMOTIDINE IN NACL 20-0.9 MG/50ML-% IV SOLN
20.0000 mg | Freq: Once | INTRAVENOUS | Status: AC
  Administered 2020-07-24: 20 mg via INTRAVENOUS
  Filled 2020-07-24: qty 50

## 2020-07-24 NOTE — ED Provider Notes (Signed)
[facility] Emergency Department Provider Note  ____________________________________________   Event Date/Time   First MD Initiated Contact with Patient 07/24/20 2259     (approximate)  I have reviewed the triage vital signs and the nursing notes.   HISTORY  Chief Complaint Allergic Reaction    HPI Denise Madden is a 60 y.o. female with history of hypertension, hyperlipidemia, diabetes who presents to the emergency department with complaints of scratchy throat, eye swelling, lips swelling, itching all over after 9 pm.  No hives seen.  She took 2 Benadryl tablets at home with minimal relief.  States she thinks it is due to a new puppy that her grandson got yesterday.  No other new exposures including new soaps, lotions, detergents, medications, foods.  She denies chest pain or shortness of breath.  No dizziness.  She is not on an ACE inhibitor.  She has never had similar symptoms.   Past Medical History:  Diagnosis Date  . Diabetes mellitus without complication ([place])   . Hemorrhoids   . Hyperlipidemia   . Hypertension     There are no problems to display for this patient.   Past Surgical History:  Procedure Laterality Date  . ABDOMINAL HYSTERECTOMY    . CESAREAN SECTION    . COLONOSCOPY  2007   [facility]  . COLONOSCOPY WITH PROPOFOL N/A 12/20/2015   Procedure: COLONOSCOPY WITH PROPOFOL;  Surgeon: Christene Lye, MD;  Location: [facility] ENDOSCOPY;  Service: Endoscopy;  Laterality: N/A;  . HAND SURGERY Left     Prior to Admission medications   Medication Sig Start Date End Date Taking? Authorizing Provider  EPINEPHrine 0.3 mg/0.3 mL IJ SOAJ injection Inject 0.3 mg into the muscle as needed for anaphylaxis. 07/25/20  Yes Bazil Dhanani, Delice Bison, DO  famotidine (PEPCID) 20 MG tablet Take 1 tablet (20 mg total) by mouth 2 (two) times daily. 07/25/20 07/25/21 Yes Nahia Nissan, Delice Bison, DO  predniSONE (DELTASONE) 20 MG tablet Take 3 tablets (60 mg total) by mouth as  directed. Take 60 mg x 3 days then 40 mg x 2 days then 20 mg x 2 days 07/25/20  Yes Andersen Iorio, Cyril Mourning N, DO  amLODipine-atorvastatin (CADUET) 5-40 MG tablet Take 1 tablet by mouth at bedtime. 12/07/19   [provider]  aspirin 81 MG tablet Take 81 mg by mouth daily.    [provider]  benzonatate (TESSALON) 100 MG capsule Take 1 capsule (100 mg total) by mouth every 8 (eight) hours. 02/15/20   Margarette Canada, NP  folic acid (FOLVITE) 580 MCG tablet Take 400 mcg by mouth daily.    [provider]  insulin glargine (LANTUS) 100 UNIT/ML injection Inject 60 Units into the skin at bedtime.     [provider]  metFORMIN (GLUCOPHAGE) 500 MG tablet Take 500 mg by mouth 2 (two) times daily with a meal.    [provider]  Multiple Vitamins-Minerals (MULTIVITAMIN GUMMIES ADULT PO) Take by mouth.    [provider]  promethazine-dextromethorphan (PROMETHAZINE-DM) 6.25-15 MG/5ML syrup Take 5 mLs by mouth 4 (four) times daily as needed. 02/15/20   Margarette Canada, NP  valsartan-hydrochlorothiazide (DIOVAN-HCT) 160-12.5 MG tablet Take 1 tablet by mouth daily. 12/07/19   [provider]  amLODipine (NORVASC) 5 MG tablet  08/10/15 02/15/20  [provider]  atorvastatin (LIPITOR) 20 MG tablet  08/10/15 02/15/20  [provider]  ezetimibe (ZETIA) 10 MG tablet Take 10 mg by mouth daily.  02/15/20  [provider]  lisinopril (PRINIVIL,ZESTRIL) 20 MG tablet Take 20 mg by mouth daily.  02/15/20  [provider]  simvastatin (ZOCOR) 40 MG tablet Take 40 mg by mouth daily.  02/15/20  [provider]    Allergies Patient has no known allergies.  Family History  Problem Relation Age of Onset  . Diabetes Mother   . Melanoma Father   . Prostate cancer Maternal Grandfather   . Breast cancer Paternal Aunt 4    Social History Social History   Tobacco Use  . Smoking status: Never Smoker  . Smokeless tobacco: Never  Used  Vaping Use  . Vaping Use: Never used  Substance Use Topics  . Alcohol use: No    Alcohol/week: 0.0 standard drinks  . Drug use: No    Review of Systems Constitutional: No fever. Eyes: No visual changes. ENT: No sore throat. Cardiovascular: Denies chest pain. Respiratory: Denies shortness of breath. Gastrointestinal: No nausea, vomiting, diarrhea. Genitourinary: Negative for dysuria. Musculoskeletal: Negative for back pain. Skin: Negative for rash.  + Itching Neurological: Negative for focal weakness or numbness.  ____________________________________________   PHYSICAL EXAM:  VITAL SIGNS: ED Triage Vitals  Enc Vitals Group     BP 07/24/20 2236 (!) 205/118     Pulse Rate 07/24/20 2236 (!) 58     Resp 07/24/20 2236 16     Temp 07/24/20 2236 98.2 F (36.8 C)     Temp Source 07/24/20 2236 Oral     SpO2 07/24/20 2236 98 %     Weight 07/24/20 2239 165 lb (74.8 kg)     Height 07/24/20 2239 5\' 2"  (1.575 m)     Head Circumference --      Peak Flow --      Pain Score 07/24/20 2238 0     Pain Loc --      Pain Edu? --      Excl. in [place]? --    CONSTITUTIONAL: Alert and oriented and responds appropriately to questions. Well-appearing; well-nourished HEAD: Normocephalic EYES: Conjunctivae clear, pupils appear equal, EOM appear intact, mild swelling noted to her upper eyelids bilaterally ENT: normal nose; moist mucous membranes; no angioedema, normal phonation, to trismus or drooling, no stridor, posterior oropharynx is patent without swelling, erythema, tonsillar hypertrophy or exudate NECK: Supple, normal ROM CARD: RRR; S1 and S2 appreciated; no murmurs, no clicks, no rubs, no gallops RESP: Normal chest excursion without splinting or tachypnea; breath sounds clear and equal bilaterally; no wheezes, no rhonchi, no rales, no hypoxia or respiratory distress, speaking full sentences ABD/GI: Normal bowel sounds; non-distended; soft, non-tender, no rebound, no guarding, no  peritoneal signs, no hepatosplenomegaly BACK: The back appears normal EXT: Normal ROM in all joints; no deformity noted, no edema; no cyanosis SKIN: Normal color for age and race; warm; no rash on exposed skin, no hives NEURO: Moves all extremities equally PSYCH: The patient's mood and manner are appropriate.  ____________________________________________   LABS (all labs ordered are listed, but only abnormal results are displayed)  Labs Reviewed - No data to display ____________________________________________  EKG   EKG Interpretation  Date/Time:  Monday July 24 2020 22:48:06 EDT Ventricular Rate:  55 PR Interval:    QRS Duration: 96 QT Interval:  421 QTC Calculation: 403 R Axis:   6 Text Interpretation: Sinus rhythm Low voltage, precordial leads Confirmed by Pryor Curia [phone]) on 07/24/2020 11:14:12 PM       ____________________________________________  RADIOLOGY Jessie Foot Ahlaya Ende, personally viewed and evaluated these images (plain radiographs) as part  of my medical decision making, as well as reviewing the written report by the radiologist.  ED MD interpretation:  none  Official radiology report(s): No results found.  ____________________________________________   PROCEDURES  Procedure(s) performed (including Critical Care):  Procedures  CRITICAL CARE Performed by: Pryor Curia   Total critical care time: 45 minutes  Critical care time was exclusive of separately billable procedures and treating other patients.  Critical care was necessary to treat or prevent imminent or life-threatening deterioration.  Critical care was time spent personally by me on the following activities: development of treatment plan with patient and/or surrogate as well as nursing, discussions with consultants, evaluation of patient's response to treatment, examination of patient, obtaining history from patient or surrogate, ordering and performing treatments and interventions,  ordering and review of laboratory studies, ordering and review of radiographic studies, pulse oximetry and re-evaluation of patient's condition.  ____________________________________________   INITIAL IMPRESSION / ASSESSMENT AND PLAN / ED COURSE  As part of my medical decision making, I reviewed the following data within the [place] notes reviewed and incorporated, EKG interpreted NSR, Old EKG reviewed, Old chart reviewed and Notes from prior ED visits         Patient here with complaints of eyelid swelling, lip swelling, throat closing, pruritus diffusely without appreciable times after being exposed to a new dog in her house.  Given multiple symptoms including concerns for airway involvement, will give IV Solu-Medrol, Pepcid as well as epinephrine.  She did take 2 Benadryl at home.  Currently hemodynamically stable.  No hypoxia.  Lungs are clear to auscultation.  We will continue to closely monitor.  ED PROGRESS  Patient has been reevaluated multiple times and continues to be hemodynamically stable.  Still complaining of some sore throat but is able to swallow here.  Posterior oropharynx appears patent without signs of pharyngitis, PTA, deep space neck infection, uvulitis.  Recommended that she have her grandson remove the dog from her home.  Will discharge on Pepcid, steroids and have her continue Benadryl.  Will give EpiPen prescription as needed as well.  Discussed return precautions.  She has been observed for 4 hours after epinephrine.  Her eyelid swelling has resolved.  She continues to be hemodynamically stable.   At this time, I do not feel there is any life-threatening condition present. I have reviewed, interpreted and discussed all results (EKG, imaging, lab, urine as appropriate) and exam findings with patient/family. I have reviewed nursing notes and appropriate previous records.  I feel the patient is safe to be discharged home without further emergent  workup and can continue workup as an outpatient as needed. Discussed usual and customary return precautions. Patient/family verbalize understanding and are comfortable with this plan.  Outpatient follow-up has been provided as needed. All questions have been answered.  ____________________________________________   FINAL CLINICAL IMPRESSION(S) / ED DIAGNOSES  Final diagnoses:  Allergic reaction, initial encounter     ED Discharge Orders         Ordered    predniSONE (DELTASONE) 20 MG tablet  As directed        07/25/20 0333    famotidine (PEPCID) 20 MG tablet  2 times daily        07/25/20 0333    EPINEPHrine 0.3 mg/0.3 mL IJ SOAJ injection  As needed        07/25/20 7035          *Please note:  KEAJAH KILLOUGH was evaluated in Emergency  Department on 07/25/2020 for the symptoms described in the history of present illness. She was evaluated in the context of the global COVID-19 pandemic, which necessitated consideration that the patient might be at risk for infection with the SARS-CoV-2 virus that causes COVID-19. Institutional protocols and algorithms that pertain to the evaluation of patients at risk for COVID-19 are in a state of rapid change based on information released by regulatory bodies including the CDC and federal and state organizations. These policies and algorithms were followed during the patient's care in the ED.  Some ED evaluations and interventions may be delayed as a result of limited staffing during and the pandemic.*   Note:  This document was prepared using Dragon voice recognition software and may include unintentional dictation errors.   Ziyana Morikawa, Delice Bison, DO 07/25/20 [phone]

## 2020-07-24 NOTE — ED Triage Notes (Signed)
Pt states that about an hour ago her throat started itching, then her eyes started to swell and she started to itch all over. Pt states she took a benadryl and symptoms started to improve but now throat continues to itch and feels as though it is swelling. Pt noted to have frequent throat clearing, having to stop during conversation. Swelling noted to bilateral eyes, pt states she is not aware of any allergies.

## 2020-07-25 MED ORDER — EPINEPHRINE 0.3 MG/0.3ML IJ SOAJ: 0.3000 mg | INTRAMUSCULAR | 1 refills | Status: AC | PRN

## 2020-07-25 MED ORDER — PREDNISONE 20 MG PO TABS: 60.0000 mg | ORAL_TABLET | ORAL | 0 refills | Status: DC

## 2020-07-25 MED ORDER — FAMOTIDINE 20 MG PO TABS: 20.0000 mg | ORAL_TABLET | Freq: Two times a day (BID) | ORAL | 0 refills | Status: AC

## 2020-07-25 MED ORDER — ACETAMINOPHEN 500 MG PO TABS
1000.0000 mg | ORAL_TABLET | Freq: Once | ORAL | Status: AC
  Administered 2020-07-25: 1000 mg via ORAL
  Filled 2020-07-25: qty 2

## 2020-07-25 MED ORDER — PREDNISONE 20 MG PO TABS: 60.0000 mg | ORAL_TABLET | ORAL | 0 refills | Status: AC

## 2020-07-25 NOTE — ED Notes (Signed)
Pt called RN to room due to coughing. Pt noted to have forceful cough and had produced clear mucus with scant blood streaks. Pt was standing at the foot of bed coughing. Pt assisted back to bed for safety and was given emesis bag for cough. Pt able to speak in full sentences, and sts she feels like her throat is sticky and has something in it.  Dr Leonides Schanz notified.

## 2020-07-25 NOTE — ED Notes (Signed)
ED Provider at bedside. 

## 2020-07-25 NOTE — Discharge Instructions (Signed)
Please continue Benadryl 50 mg every 6 hours as needed for itching, swelling.

## 2020-08-04 ENCOUNTER — Other Ambulatory Visit: Payer: Self-pay | Admitting: Internal Medicine

## 2020-08-04 DIAGNOSIS — Z1231 Encounter for screening mammogram for malignant neoplasm of breast: Secondary | ICD-10-CM

## 2021-01-19 IMAGING — CR DG CHEST 2V
2 series · 2 of 2 positions shown · non-contrast
Comparison: None.

CLINICAL DATA: Cough, dyspnea

EXAM:
CHEST - 2 VIEW

[chest pa]
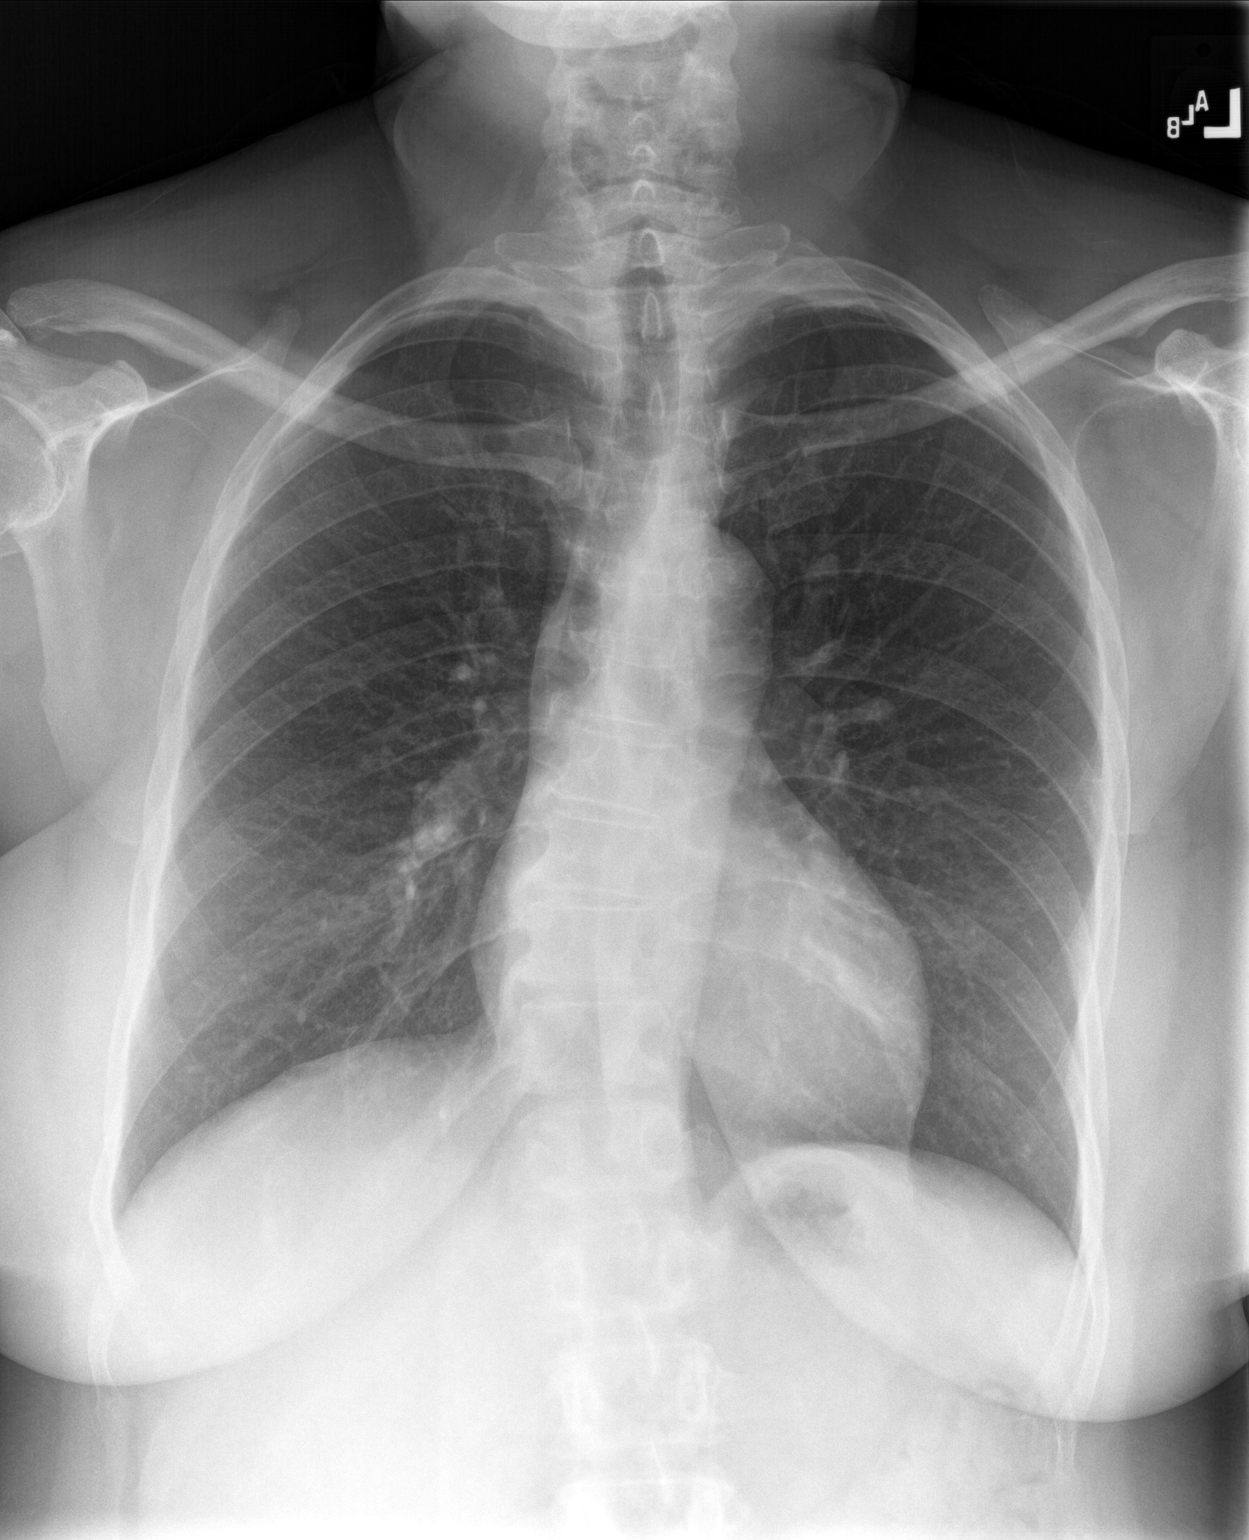

[chest lat]
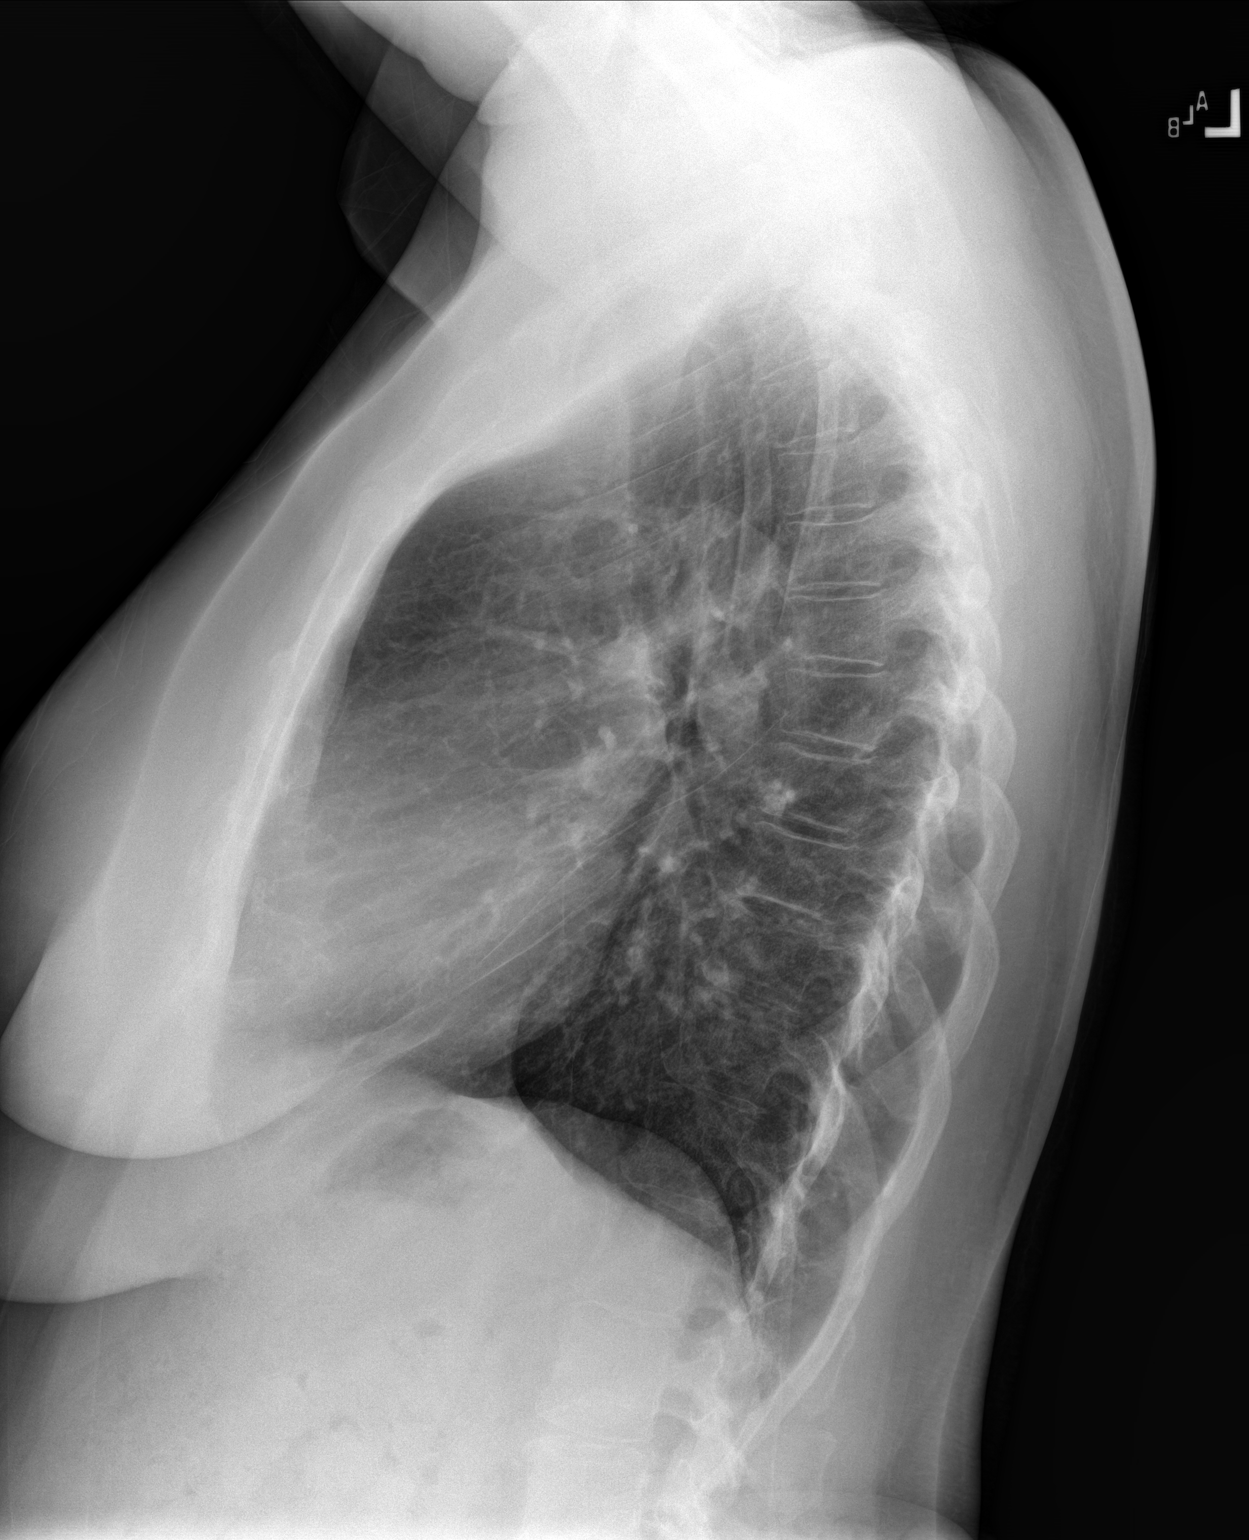

[2 of 2 positions shown; findings below may reference images not displayed]

FINDINGS: Lungs are clear. No pneumothorax or pleural effusion. Cardiac size
within normal limits. Pulmonary vascularity is normal. Mild thoracic
sigmoid scoliosis noted. No acute bone abnormality.
IMPRESSION: No active cardiopulmonary disease.

## 2021-08-10 ENCOUNTER — Other Ambulatory Visit: Payer: Self-pay | Admitting: Internal Medicine

## 2021-08-10 DIAGNOSIS — Z1231 Encounter for screening mammogram for malignant neoplasm of breast: Secondary | ICD-10-CM

## 2021-09-20 ENCOUNTER — Ambulatory Visit
Admission: RE | Admit: 2021-09-20 | Discharge: 2021-09-20 | Disposition: A | Discharge status: Home / Self Care | Payer: BC Managed Care – PPO | Source: Ambulatory Visit | Attending: Internal Medicine | Admitting: Internal Medicine

## 2021-09-20 DIAGNOSIS — Z1231 Encounter for screening mammogram for malignant neoplasm of breast: Secondary | ICD-10-CM | POA: Diagnosis present

## 2023-08-22 ENCOUNTER — Other Ambulatory Visit: Payer: Self-pay | Admitting: Internal Medicine

## 2023-08-22 DIAGNOSIS — Z1231 Encounter for screening mammogram for malignant neoplasm of breast: Secondary | ICD-10-CM

## 2023-09-09 ENCOUNTER — Ambulatory Visit
Admission: RE | Admit: 2023-09-09 | Discharge: 2023-09-09 | Disposition: A | Discharge status: Home / Self Care | Source: Ambulatory Visit | Attending: Internal Medicine | Admitting: Internal Medicine

## 2023-09-09 DIAGNOSIS — Z1231 Encounter for screening mammogram for malignant neoplasm of breast: Secondary | ICD-10-CM | POA: Insufficient documentation
